# Patient Record
Sex: Female | Born: 1950
Health system: Southern US, Community
[De-identification: ages and names within clinical notes are randomized; demographics above are authoritative.]

## PROBLEM LIST (undated history)

## (undated) ENCOUNTER — Emergency Department (HOSPITAL_COMMUNITY): Admission: EM | Discharge status: Against Medical Advice | Payer: Self-pay

## (undated) DIAGNOSIS — K5792 Diverticulitis of intestine, part unspecified, without perforation or abscess without bleeding: Secondary | ICD-10-CM

## (undated) DIAGNOSIS — R51 Headache: Secondary | ICD-10-CM

## (undated) DIAGNOSIS — I10 Essential (primary) hypertension: Secondary | ICD-10-CM

## (undated) DIAGNOSIS — E119 Type 2 diabetes mellitus without complications: Secondary | ICD-10-CM

## (undated) DIAGNOSIS — R519 Headache, unspecified: Secondary | ICD-10-CM

## (undated) DIAGNOSIS — E079 Disorder of thyroid, unspecified: Secondary | ICD-10-CM

## (undated) DIAGNOSIS — D126 Benign neoplasm of colon, unspecified: Secondary | ICD-10-CM

## (undated) HISTORY — PX: ABDOMINAL HYSTERECTOMY: SHX81

## (undated) HISTORY — PX: HYSTERECTOMY ABDOMINAL WITH SALPINGO-OOPHORECTOMY: SHX6792

## (undated) HISTORY — PX: COLON SURGERY: SHX602

## (undated) HISTORY — DX: Benign neoplasm of colon, unspecified: D12.6

## (undated) HISTORY — PX: APPENDECTOMY: SHX54

## (undated) HISTORY — PX: TONSILLECTOMY: SUR1361

---

## 2001-03-22 ENCOUNTER — Encounter: Payer: Self-pay | Admitting: Obstetrics and Gynecology

## 2001-03-22 ENCOUNTER — Ambulatory Visit (HOSPITAL_COMMUNITY): Admission: RE | Admit: 2001-03-22 | Discharge: 2001-03-22 | Payer: Self-pay | Admitting: Obstetrics and Gynecology

## 2001-08-10 ENCOUNTER — Other Ambulatory Visit: Admission: RE | Admit: 2001-08-10 | Discharge: 2001-08-10 | Payer: Self-pay | Admitting: Obstetrics and Gynecology

## 2002-02-15 ENCOUNTER — Ambulatory Visit (HOSPITAL_COMMUNITY): Admission: RE | Admit: 2002-02-15 | Discharge: 2002-02-15 | Payer: Self-pay | Admitting: Family Medicine

## 2002-02-15 ENCOUNTER — Encounter: Payer: Self-pay | Admitting: Family Medicine

## 2002-02-20 ENCOUNTER — Ambulatory Visit (HOSPITAL_COMMUNITY): Admission: RE | Admit: 2002-02-20 | Discharge: 2002-02-20 | Payer: Self-pay | Admitting: Family Medicine

## 2002-02-27 ENCOUNTER — Ambulatory Visit (HOSPITAL_COMMUNITY): Admission: RE | Admit: 2002-02-27 | Discharge: 2002-02-27 | Payer: Self-pay | Admitting: Cardiology

## 2002-02-27 ENCOUNTER — Encounter: Payer: Self-pay | Admitting: Cardiology

## 2002-04-01 ENCOUNTER — Inpatient Hospital Stay (HOSPITAL_COMMUNITY): Admission: RE | Admit: 2002-04-01 | Discharge: 2002-04-03 | Payer: Self-pay | Admitting: Obstetrics and Gynecology

## 2003-01-07 ENCOUNTER — Ambulatory Visit (HOSPITAL_COMMUNITY): Admission: RE | Admit: 2003-01-07 | Discharge: 2003-01-07 | Payer: Self-pay | Admitting: Family Medicine

## 2003-01-07 ENCOUNTER — Encounter: Payer: Self-pay | Admitting: Family Medicine

## 2004-08-02 ENCOUNTER — Ambulatory Visit (HOSPITAL_COMMUNITY): Admission: RE | Admit: 2004-08-02 | Discharge: 2004-08-02 | Payer: Self-pay | Admitting: Family Medicine

## 2004-09-13 ENCOUNTER — Ambulatory Visit (HOSPITAL_COMMUNITY): Admission: RE | Admit: 2004-09-13 | Discharge: 2004-09-13 | Payer: Self-pay | Admitting: Internal Medicine

## 2004-09-13 ENCOUNTER — Ambulatory Visit: Payer: Self-pay | Admitting: Internal Medicine

## 2004-09-17 ENCOUNTER — Emergency Department (HOSPITAL_COMMUNITY): Admission: EM | Admit: 2004-09-17 | Discharge: 2004-09-18 | Payer: Self-pay | Admitting: *Deleted

## 2004-11-29 ENCOUNTER — Encounter (INDEPENDENT_AMBULATORY_CARE_PROVIDER_SITE_OTHER): Payer: Self-pay | Admitting: *Deleted

## 2004-11-29 ENCOUNTER — Ambulatory Visit (HOSPITAL_COMMUNITY): Admission: RE | Admit: 2004-11-29 | Discharge: 2004-11-29 | Payer: Self-pay | Admitting: Gastroenterology

## 2005-03-31 ENCOUNTER — Inpatient Hospital Stay (HOSPITAL_COMMUNITY): Admission: RE | Admit: 2005-03-31 | Discharge: 2005-04-05 | Payer: Self-pay | Admitting: General Surgery

## 2005-03-31 ENCOUNTER — Encounter (INDEPENDENT_AMBULATORY_CARE_PROVIDER_SITE_OTHER): Payer: Self-pay | Admitting: Specialist

## 2006-03-26 DIAGNOSIS — D126 Benign neoplasm of colon, unspecified: Secondary | ICD-10-CM

## 2006-03-26 HISTORY — DX: Benign neoplasm of colon, unspecified: D12.6

## 2006-08-14 ENCOUNTER — Ambulatory Visit (HOSPITAL_COMMUNITY): Admission: RE | Admit: 2006-08-14 | Discharge: 2006-08-14 | Payer: Self-pay | Admitting: Family Medicine

## 2007-02-07 ENCOUNTER — Ambulatory Visit (HOSPITAL_COMMUNITY): Admission: RE | Admit: 2007-02-07 | Discharge: 2007-02-07 | Payer: Self-pay | Admitting: Family Medicine

## 2007-10-29 ENCOUNTER — Ambulatory Visit (HOSPITAL_COMMUNITY): Admission: RE | Admit: 2007-10-29 | Discharge: 2007-10-29 | Payer: Self-pay | Admitting: Family Medicine

## 2007-12-25 ENCOUNTER — Ambulatory Visit (HOSPITAL_COMMUNITY): Admission: RE | Admit: 2007-12-25 | Discharge: 2007-12-25 | Payer: Self-pay | Admitting: Family Medicine

## 2009-07-24 ENCOUNTER — Ambulatory Visit (HOSPITAL_COMMUNITY): Admission: RE | Admit: 2009-07-24 | Discharge: 2009-07-24 | Payer: Self-pay | Admitting: Family Medicine

## 2009-08-18 ENCOUNTER — Ambulatory Visit (HOSPITAL_COMMUNITY): Admission: RE | Admit: 2009-08-18 | Discharge: 2009-08-18 | Payer: Self-pay | Admitting: Family Medicine

## 2009-09-14 ENCOUNTER — Ambulatory Visit (HOSPITAL_COMMUNITY): Admission: RE | Admit: 2009-09-14 | Discharge: 2009-09-15 | Payer: Self-pay | Admitting: General Surgery

## 2010-05-11 ENCOUNTER — Ambulatory Visit (HOSPITAL_COMMUNITY): Admission: RE | Admit: 2010-05-11 | Discharge: 2010-05-11 | Payer: Self-pay | Admitting: Family Medicine

## 2010-07-22 ENCOUNTER — Encounter: Admission: RE | Admit: 2010-07-22 | Discharge: 2010-07-22 | Payer: Self-pay | Admitting: Gastroenterology

## 2010-08-24 ENCOUNTER — Ambulatory Visit (HOSPITAL_COMMUNITY): Admission: RE | Admit: 2010-08-24 | Discharge: 2010-08-24 | Payer: Self-pay | Admitting: Internal Medicine

## 2010-12-27 LAB — CBC
HCT: 44.5 % (ref 36.0–46.0)
Hemoglobin: 14.8 g/dL (ref 12.0–15.0)
RBC: 4.8 MIL/uL (ref 3.87–5.11)
WBC: 6.4 10*3/uL (ref 4.0–10.5)

## 2010-12-27 LAB — COMPREHENSIVE METABOLIC PANEL
Albumin: 4.6 g/dL (ref 3.5–5.2)
Alkaline Phosphatase: 64 U/L (ref 39–117)
BUN: 10 mg/dL (ref 6–23)
Chloride: 104 mEq/L (ref 96–112)
Glucose, Bld: 67 mg/dL — ABNORMAL LOW (ref 70–99)
Potassium: 5.1 mEq/L (ref 3.5–5.1)
Total Bilirubin: 0.8 mg/dL (ref 0.3–1.2)

## 2010-12-27 LAB — GLUCOSE, CAPILLARY: Glucose-Capillary: 174 mg/dL — ABNORMAL HIGH (ref 70–99)

## 2010-12-27 LAB — DIFFERENTIAL
Basophils Absolute: 0 10*3/uL (ref 0.0–0.1)
Basophils Relative: 1 % (ref 0–1)
Monocytes Absolute: 0.4 10*3/uL (ref 0.1–1.0)
Neutro Abs: 3.2 10*3/uL (ref 1.7–7.7)
Neutrophils Relative %: 49 % (ref 43–77)

## 2011-02-11 NOTE — Op Note (Signed)
NAME:  Megan Hill, Megan Hill               ACCOUNT NO.:  [id number]   MEDICAL RECORD NO.:  [id number]          PATIENT TYPE:  INP   LOCATION:  X001                         FACILITY:  [facility]   PHYSICIAN:  Adolph Pollack, M.D.DATE OF BIRTH:  1951-01-29   DATE OF PROCEDURE:  03/31/2005  DATE OF DISCHARGE:                                 OPERATIVE REPORT   PREOPERATIVE DIAGNOSIS:  Persistent tubular adenomatous polyp, proximal  transverse colon   POSTOPERATIVE DIAGNOSIS:  Persistent tubular adenomatous polyp, proximal  transverse colon   PROCEDURE:  Laparoscopic-assisted extended right colectomy.   SURGEON:  Dr. Abbey Chatters.   ASSISTANT:  Marca Ancona, M.D.   ANESTHESIA:  General.   INDICATION:  Megan Hill is a 60 year old female who had screening  colonoscopy and was noted have a large villous-type polyp in the proximal to  mid transverse colon at that time. This was biopsied and demonstrated a  villous adenoma with postoperative increased dysplasia. She had a repeat  colonoscopy by Dr. Evette Cristal here in town and in the distal portion of the  ascending colon or more proximal portion of the transverse colon, there was  a villous-type, difficult to see and which appeared to be about 1.5 to 2 cm  in length. It was biopsied and it was a tubular-type adenoma with no high-  grade dysplasia or malignancy.  It was marked with [place] ink. However, it  was not able to be removed completely. She was given options including  advanced endoscopic techniques versus flexible partial colectomy and she  chose partial colectomy and presents for that today.   TECHNIQUE:  She is seen in the holding area. She is then brought to the  operating room, placed supine on the operating table and general anesthetic  was administered. Foley catheter was placed in the bladder. Orogastric tube  was placed. The abdominal wall was sterilely prepped and draped. A small  incision was made in a left upper quadrant and a 5 mm  trocar with the  laparoscope in it was then inserted through the abdominal wall into multiple  layers.  However, getting to peritoneal layer, I was unable to press through  this with minimal pressure. Subsequently, I made a subumbilical incision  through the skin and subcutaneous tissue, fascia and peritoneum, entering  the peritoneal cavity under direct vision. A pursestring suture of 0 Vicryl  was placed around fascial edges. An Hassan trocar was introduced in the  peritoneal cavity.  The pneumoperitoneum was created by insufflation of CO2  gas.   A laparoscopic was then introduced.  I looked in the left upper left upper  quadrant and peritoneum without apparent problems.  I then placed a 5 mm  trocar into the left upper quadrant under direct vision and traded this out  for a 10 mm trocar. The 5 mm trocar was then placed in the left mid lateral  abdomen. I identified the cecum and the ileocecal valve. I mobilized the  right colon and distal ileum by dividing the lateral attachments and  medializing the colon. I kept the dissection above the plane of the ureter.  She had a fairly floppy transverse colon. I was able to identify the [place]  ink mark just near the hepatic flexure in the proximal transverse colon. I  then mobilized the transverse colon by incising its thin attachments. Once I  had adequate mobilization of the right and transverse colon and distal ileum  I then made a periumbilical incision for the extraction site, dividing the  skin and subcutaneous tissue, fascia and peritoneum. I then was able to  exteriorize the distal ileum, right colon and transverse colon up to its mid  portion.  I divided the transverse colon its proximal one-third, distal two-  thirds area just proximal to the middle colic vessels. I then divided the  distal ileum with both these done with the GIA stapler. The mesenteric  vessels were then divided between clamps and the vessels ligated. The  specimen  was taken off the field with adequate mesentery of the right colon  and part of the transverse colon, thus more of an extended-type right  colectomy. I opened up the specimen on the back table and identified the  polypoid lesion and the site of the [place] inking marking.  It was fairly  flat, measuring about 2 cm as was described colonoscopically.   Gloves were changed. I then performed a side-to-side stapled anastomosis  closing the remaining enterotomy defect with a linear non cutting stapler. I  over sewed this area with Vicryl suture and reinforced the distal anterior  staple line to decrease tension with a 3-0 Vicryl suture. The anastomosis  was patent, viable and under no tension.   Gloves were then changed and irrigation was performed. Initially, we  evacuated some clots but the irrigant returned clear and no active bleeding  was noted. Needle, sponge and instrument counts were reported to be correct.  The periumbilical midline incision was then inspected and the fascia closed  with a running #1 PDS suture. The subcutaneous tissue was then irrigated. I  re-insufflated the abdomen at low pressure and examined the area. No  bleeding was noted. Minimal irrigation fluid was located in the perihepatic  region. I then released the pneumoperitoneum and removed the trocars. All  skin incisions were then closed with staples and sterile dressings were  applied.   She tolerated the procedure well without any apparent complications and she  was taken to the recovery room in satisfactory condition.       TJR/MEDQ  D:  03/31/2005  T:  03/31/2005  Job:  [id number]   cc:   Graylin Shiver, M.D.  [address]  [address]  [place], [place] [address]  Fax: [phone]   W. Simone Curia, M.D.  [address]  [place]  [place] [address]  Fax: [phone]

## 2011-02-11 NOTE — Discharge Summary (Signed)
NAME:  Megan Hill, Megan Hill               ACCOUNT NO.:  [id number]   MEDICAL RECORD NO.:  [id number]          PATIENT TYPE:  INP   LOCATION:  1430                         FACILITY:  [facility]   PHYSICIAN:  Adolph Pollack, M.D.DATE OF BIRTH:  1951/06/29   DATE OF ADMISSION:  03/31/2005  DATE OF DISCHARGE:  04/05/2005                                 DISCHARGE SUMMARY   PRINCIPAL DISCHARGE DIAGNOSIS:  Villous adenoma, transverse colon.   SECONDARY DIAGNOSES:  1.  Postoperative ileus.  2.  Vasovagal attack versus seizure.  3.  Hypertension.  4.  Diabetes mellitus.  5.  Hypercholesterolemia.  6.  Depression.  7.  Hypothyroidism.   PROCEDURE:  Laparoscopic-assisted extended right colectomy on March 31, 2005.   REASON FOR ADMISSION:  This 60 year old female had a screening colonoscopy  in [place], and was found to have a large villous-type polyp lesion in  the transverse colon.  It was biopsied, and demonstrated villous adenoma  with a foci of dysplasia.  She subsequently underwent repeat colonoscopy,  and the entire polyp could not be removed.  It was felt to be just distal to  the hepatic flexure.  She was admitted for elective partial colectomy.   HOSPITAL COURSE:  She tolerated the procedure well.  Postoperatively,  sliding-scale insulin was started.  She did well with these.  She had a mild  ileus, and was kept on somewhat of a liquid diet.  She was having small BMs,  but only after suppository.  She did have one episode which they felt may  have been seizure activity.  It was when she just took a hot shower.  A head  CT was negative.  Pathology came back a benign sessile, villous-type lesion.  No malignancy.  Neurology consultation was obtained.  It was felt this could  be a vasovagal episode versus seizure.  EEG was normal.  Her diet was able  to be advanced, and she spontaneously had bowel movements, and was passing  gas.  The wounds looked clean.  She was started on her oral  diabetic  medications, and her blood sugars were under good control.  By postop day  #5, she was ready for discharge.   DISPOSITION:  Discharged to home on postop day #5, April 05, 2005.  She is to  call Dr. Sandria Manly for a followup appointment and to arrange for an MRI of the  brain.  She will come to my office in a few days to have her staples  removed.  She was given Vicodin for pain, told to take her home medicines,  and given activity restrictions.       TJR/MEDQ  D:  04/11/2005  T:  04/11/2005  Job:  [id number]   cc:   Dorisann Frames, M.D.  [address] [place] [address]  Fax: [phone]   Scott A. Gerda Diss, MD  [address]  [place]  [place] [address]  Fax: [phone]   Graylin Shiver, M.D.  [address]  [address]  [place], [place] [address]  Fax: [phone]   Genene Churn. Love,  M.D.  [address]  [address]  [place]  [place] [address]  Fax: [phone]

## 2011-02-11 NOTE — Op Note (Signed)
NAME:  Megan Hill, Megan Hill               ACCOUNT NO.:  [id number]   MEDICAL RECORD NO.:  [id number]          PATIENT TYPE:  AMB   LOCATION:  DAY                           FACILITY:  [facility]   PHYSICIAN:  Lionel December, M.D.    DATE OF BIRTH:  October 31, 1950   DATE OF PROCEDURE:  09/13/2004  DATE OF DISCHARGE:                                 OPERATIVE REPORT   A very large polyp at proximal mid-transverse colon, which was snared  piecemeal.  Polypectomy was not complete.  However, more than 90% of the  polyp was snared.  I was unable to catch the pieces in a basket or snare  because of a lot of liquid stool in the colon.  Mucosa of the rest of the  colon was normal.  Rectal mucosa similarly was normal.  The scope was  retroflexed to examine the anorectal junction, which was unremarkable.  The  endoscope was straightened and withdrawn.  The patient tolerated the  procedure well.   FINAL DIAGNOSES:  1.  A redundant colon.  2.  Melanosis coli.  3.  Very large polyp at mid-to-proximal transverse colon, which was snared      piecemeal.  Most of the polyp was removed.  The polypectomy was      incomplete.   RECOMMENDATIONS:  1.  Standard instructions given.  2.  No aspirin for 10 days.  3.  She will be on clear liquids for 24 hours.  Should she experience      abdominal pain or bleeding, she will contact me through the hospital      operator.  Further recommendations to be made once histology is      available for review.  If it is a benign polyp without invasive      carcinoma, I believe the rest of the polyp should be amenable to      endoscopic therapy.     Naje   NR/MEDQ  D:  09/13/2004  T:  09/13/2004  Job:  [id number]

## 2011-02-11 NOTE — H&P (Signed)
[facility]  Patient:    Megan Hill, Megan Hill Visit Number: [id number] MRN: [id number]          Service Type: OUT Location: RAD Attending Physician:  Mirian Mo Dictated by:   Christin Bach, M.D. Admit Date:  02/27/2002 Discharge Date: 02/27/2002   CC:         Christin Bach, M.D.   History and Physical  DATE OF BIRTH:  [birth year].  ADMITTING DIAGNOSIS:  Menometrorrhagia, cystocele, rectocele, and first degree uterine descensus.  HISTORY OF PRESENT ILLNESS:  This 60 year old female followed through our office for routine annual visits is admitted at this time for hysterectomy for pelvic relaxation symptoms and heavy irregular menses.  The patient was seen for an annual visit in November with straightforward condition at that point and time other than bulbous cervix.  Exam in June of this year identified a very markedly relaxed posterior wall, moderate anterior uterine relaxation with a symptomatic rectocele to greater than 90 degrees relaxation.  The cervix was anterior first degree uterine descensus present with a bulbous cervix.  The patient is scheduled for vaginal hysterectomy with probable removal of ovaries vaginally, along with posterior repair.  Considerations of anterior repair to be decided at the time of surgery.  PAST MEDICAL HISTORY:  History of chest pain recently evaluated by Dr. Valera Castle with negative workup for cardiac problems.  PAST SURGICAL HISTORY:  Tubal ligation in 1977.  Tonsillectomy in 1960.  ALLERGIES:  CODEINE causes nausea.  PENICILLIN allergy as a child.  MEDICATIONS:  None at the present.  PHYSICAL EXAMINATION:  VITAL SIGNS:  Height 5 feet 3 inches.  Weight 174.  Blood pressure 120/75.  GENERAL:  A healthy-appearing energetic Caucasian female alert and oriented x 3.  HEENT:  Pupils are equal, round and reactive.  Extraocular movements intact.  NECK:  Supple.  Trachea midline.  CHEST:   Clear to auscultation.  BREASTS:  Normal without masses.  ABDOMEN:  Nontender without masses or hernias.  PELVIC:  External genitalia shows a relaxed introitus.  Bulbous cervix. Anterior uterus with first degree uterine descensus present.  Adnexa is negative for masses.  Posterior rectocele with significant anterior wall support superior to the posterior wall and decision regarding its repair will be determined at the time of general anesthesia.  PLAN: 1. Vaginal hysterectomy. 2. Bilateral salpingo-oophorectomy. 3. Posterior repair and possible anterior repair on April 01, 2002. Dictated by:   Christin Bach, M.D. Attending Physician:  Mirian Mo DD:  03/31/02 TD:  03/31/02 Job: [id number]

## 2011-02-11 NOTE — Op Note (Signed)
[facility]  Patient:    Megan Hill, Megan Hill Visit Number: [id number] MRN: [id number]          Service Type: MED Location: 4A A427 01 Attending Physician:  Tilda Burrow Dictated by:   Christin Bach, M.D. Proc. Date: 04/01/02 Admit Date:  04/01/2002 Discharge Date: 04/03/2002                             Operative Report  PREOPERATIVE DIAGNOSES: 1. First degree uterine descensus. 2. Menometrorrhagia. 3. Rectocele.  POSTOPERATIVE DIAGNOSES: 1. First degree uterine descensus. 2. Menometrorrhagia. 3. Rectocele.  PROCEDURES: 1. Vaginal hysterectomy, bilateral salpingo-oophorectomy. 2. Posterior repair.  SURGEON:  Christin Bach, M.D.  FIRST ASSISTANT:  [employer], C.S.T.  ANESTHESIA:  General.  COMPLICATIONS:  None.  ESTIMATED BLOOD LOSS:  300 cc.  FINDINGS:  Rectocele, upper limits of normal size uterus.  DESCRIPTION OF PROCEDURE:  The patient was taken to the operating room, prepped and draped for vaginal procedure with legs supported in _____ supports.  Posterior colpotomy incision was made above a three-inch weighted speculum and the peritoneal cavity entered with ease.  Uterosacral ligaments were clamped with curved Zeppelin clamp, transected, and suture ligated with 0 chromic and tagged.  A longer six-inch speculum was inserted behind the uterus.  The anterior cervicovaginal fornix was opened with Bovie cautery through the vaginal mucosa, then elevated and the bladder lifted off and held up with a retractor.  We then clamped the lower cardinal ligaments with curved Zeppelin clamp with transection of the ligaments and suture ligation.  The upper cardinal ligaments were serially clamped, cut, and suture ligated as well.  At this point we entered into the anterior peritoneum in front of the uterus and behind the bladder.  The uterine vessels were clamped, cut, and suture ligated on either side.  We marched serially up the  sides of the uterus with serial small bites with Zeppelin clamps, followed by [facility] scissor transection and 0 chromic suture ligation.  A figure-of-eight suture was required on the right side to address some continued bleeding from an area of the vessels.  We marched up each side to the level of the adnexal structures, and the utero-ovarian ligament, fallopian tube, and round ligament complex were crossclamped and doubly ligated.  Pedicles were inspected for hemostasis and a figure-of-eight suture placed as necessary to improve hemostasis.  At this point in time an assessment was made to remove ovaries.  Both ovaries and fallopian tubes could be identified, grasped with Babcock clamp, and pulled downward sufficiently to crossclamp the infundibulopelvic ligament with a Zeppelin clamp, which was then used as we doubly ligated the pedicle and excised the surgical specimen.  The patient then had closure of the cuff by 2-0 chromic closure of the peritoneal cavity after placement of a 2-0 silk suture between the uterosacral ligaments posteriorly.  Once the peritoneum was closed, the silk suture was tied down and the supportive cuff was subsequently quite good.  The cuff was then closed in the midline and hysterectomy considered satisfactorily completed.  Posterior repair:  Posterior repair then proceeded by infiltrating the vaginal mucosa overlying the rectum with Xylocaine with epinephrine solution, then splitting the posterior vaginal mucosa in the midline.  The vagina and vaginal mucosa was elevated off of the underlying connective tissue, and a doubly-gloved right index finger was inserted in the rectum to help allow [place] to identify good supportive tissues just above the rectum.  Allis clamps were used to grasp this tissue, which was then held in position, gloves changed, and we then proceeded to pull the pararectal tissues into the midline overlying the rectal mucosa in such a way to build a  strong, stable supportive shelf between the rectum and the vagina.  Then the vaginal mucosa was trimmed and then the edges of the tissue were then pulled in the midline with 2-0 chromic interrupted sutures.  Vaginal packing was placed, Foley catheter inserted, revealing clear urine, and the patient allowed to go the recovery room in good condition.  Estimated blood loss _____ cc. Dictated by:   Christin Bach, M.D. Attending Physician:  Tilda Burrow DD:  04/18/02 TD:  04/23/02 Job: [id number]

## 2011-02-11 NOTE — Procedures (Signed)
   NAME:  Megan Hill, Megan Hill NO.:  [id number]   MEDICAL RECORD NO.:  [id number]                  PATIENT TYPE:   LOCATION:                                       FACILITY:   PHYSICIAN:  Donna Bernard, M.D.             DATE OF BIRTH:   DATE OF PROCEDURE:  02/20/2002  DATE OF DISCHARGE:                                    STRESS TEST   INDICATION FOR TEST:  The patient is experiencing chest discomfort at times  with exertion.  She does have some risk factors along with a family history  of coronary artery disease.  Stress testing is performed for risk  stratification.   PROCEDURE:  Stress test was performed with standard Bruce protocol.  Resting  EKG revealed normal sinus rhythm with no significant ST-T changes.  The  patient tolerated the first 2 stages relatively well.  At the end of the  second stage, her test was stopped on the basis of fatigue and shortness of  breath.  She reached a maximum heart rate of 154 with a sub max predicted of  146.  At this point, at .08 seconds past the J point, there was significant  ST segment depression, as much as 2-3 ml in leads 2 and 3 and AVF at the ST  segment at .08 seconds from the J point was relatively flat.  Similar  finding was in D4 through D6.   IMPRESSION:  Positive adequate stress test.   PLAN:  Cardiology referral to be performed by me.                                               Donna Bernard, M.D.    WSL/MEDQ  D:  09/07/2002  T:  09/08/2002  Job:  [id number]

## 2011-02-11 NOTE — Procedures (Signed)
DIAGNOSTIC DESCRIPTION:  This EEG was recorded during the awake state.  Background activity shows 10 Hz rhythms with higher amplitudes seen at  posterior head regions bilaterally. Photic stimulation and hyperventilation  testing were not performed. There was no evidence of any focal asymmetry or  stage II sleep present. There was no evidence of any epileptiform activity  seen.   IMPRESSION:  This is a normal EEG during the awake state.       UXL:KGMW  D:  04/04/2005 17:52:19  T:  04/04/2005 22:28:05  Job #:  [id number]

## 2011-02-11 NOTE — Discharge Summary (Signed)
NAME:  Megan Hill, Megan Hill                         ACCOUNT NO.:  [id number]   MEDICAL RECORD NO.:  [id number]                   PATIENT TYPE:  INP   LOCATION:  A427                                 FACILITY:  [facility]   PHYSICIAN:  Tilda Burrow, M.D.              DATE OF BIRTH:  [birth year]   DATE OF ADMISSION:  04/01/2002  DATE OF DISCHARGE:  04/03/2002                                 DISCHARGE SUMMARY   ADMITTING DIAGNOSES:  Menometrorrhagia, uterine fibroids, rectocele.   DISCHARGE DIAGNOSES:  1st degree uterine descensus.  Menometrorrhagia.  Rectocele.   PROCEDURE:  On 04/01/2002, vaginal hysterectomy, bilateral salpingo-  oophorectomy, posterior repair; Tilda Burrow, M.D.   DISCHARGE MEDICATIONS:  Tylox 1 q.4h p.r.n. pain, dispense 30; iron tablets  325 mg, iron sulfate tablets 325 mg 1 p.o. q.d. x 30 days, Surfak 240 mg  stool softener 2 tablets daily.   FOLLOW UP:  Follow-up in three weeks and six weeks in our office.   HISTORY OF PRESENT ILLNESS:  This is a 60 year old female followed through  our office for regular annual exams was admitted for hysterectomy for pelvic  relaxation symptoms and heavy irregular menses.  There was a symptomatic  rectocele present.  1st degree uterine descensus is present.  There is  questionable relaxation of the aspects of the anterior vagina, not  necessarily requiring repair.   PAST MEDICAL HISTORY:  A history of chest pain evaluated by Dr. Daleen Squibb with  negative work up. A tubal ligation in 1977.  A tonsillectomy in 1960.   ALLERGIES:  CODEINE causing nausea.  PENICILLIN allergies.   PHYSICAL EXAMINATION:  VITAL SIGNS:  Height 5 feet 3 inches, weight 174, BP  120/75.  PELVIS:  Pelvic exams showing lax introitus, vulvar, cervix, 1st degree  descensus.  No masses in the adnexa.  Rectocele present with anterior  support considered borderline for anterior repair.   HOSPITAL COURSE:  Vaginal hysterectomy with removal of ovaries and  posterior  repair was performed 04/01/2002.  The patient has been getting labs including  hemoglobin 12.6, hematocrit 36.2, WBC count 8,900.  On postoperative day  one, the patient had pulse in the 70s with hemoglobin 10.2, hematocrit 30  with abdomen soft and slight fullness present.  The patient looked more pale  than we would have anticipated.  Urine output was 800 cc in two hours that  day, so hydration was considered excellent.  Postop day two, hemoglobin was  10 even, hematocrit 28.9, BUN 3, creatinine 0.9.  The patient was  complaining of minor gas pains and vomiting, but was stable and considered a  candidate for home management.  She is discharged home after Dulcolax  suppository resulted in passage of gas and some loose stool.    CONDITION ON DISCHARGE:  Stable.   FOLLOW UP:  Follow-up as stated through my office.  Tilda Burrow, M.D.    JVF/MEDQ  D:  04/18/2002  T:  04/28/2002  Job:  [id number]   cc:   Tilda Burrow, M.D.

## 2011-02-11 NOTE — Procedures (Signed)
[facility]  Patient:    Megan Hill, Megan Hill Visit Number: [id number] MRN: [id number]          Service Type: OUT Location: RAD Attending Physician:  Cain Sieve Dictated by:   Jacolyn Reedy, P.A.C. Proc. Date: 02/27/02 Admit Date:  02/27/2002 Discharge Date: 02/27/2002                                Stress Test  This is a 60 year old white female patient who had an abnormal stress test last week.  Also has a family history of coronary artery disease and hyperlipidemia.  She had some upsloping ST changes associated with chest tightness on stress test last week.  Baseline EKG:  Normal sinus rhythm. Patient exercised seven minutes 45 seconds of the Bruce protocol which is a minute and 45 seconds longer than last week.  She obtained a heart rate of 145, target heart rate 146.  She complained of mild chest tightness and had 1-1.5 mm upsloping ST depression inferolaterally which resolved in recovery. Cardiolite images are to follow. Dictated by:   Jacolyn Reedy, P.A.C. Attending Physician:  Cain Sieve DD:  02/27/02 TD:  03/01/02 Job: [id number]

## 2011-02-11 NOTE — Op Note (Signed)
NAME:  Megan Hill, Megan Hill NO.:  [id number]   MEDICAL RECORD NO.:  [id number]          PATIENT TYPE:  AMB   LOCATION:  ENDO                         FACILITY:  [facility]   PHYSICIAN:  Graylin Shiver, M.D.   DATE OF BIRTH:  03-19-1951   DATE OF PROCEDURE:  11/29/2004  DATE OF DISCHARGE:                                 OPERATIVE REPORT   PROCEDURE:  Colonoscopy with biopsy and [place] ink marking.   INDICATIONS:  This patient is a 60 year old female who in December of last  year underwent a colonoscopy with polypectomy with removal of a portion of a  large tubulovillous adenoma from the region of the proximal to mid  transverse colon.  The procedure was done in [place].  The patient  requested further evaluation here to reassess polyp and came to see me.   Informed consent was obtained after explanation of the risks of bleeding,  infection, and perforation.   PREMEDICATIONS:  Fentanyl 100 mcg IV, Versed 10 mg IV.   PROCEDURE:  With the patient in the left lateral decubitus position, a  rectal exam was performed.  No masses were felt.  The Olympus colonoscope  was inserted into the rectum and advanced around the colon to the cecum.  Cecal landmarks were identified.  The colon was tortuous.  The cecum  revealed a small 3 mm polyp that was biopsied off.  In the region of the  distal portion of the ascending colon, or proximal portion of the transverse  colon, there was a carpet-like polyp on a fold, which was very often  difficult to see.  I was able to get a photograph of it.  It appeared to be  about 1.5 cm or 2 cm in length.  The polyp on the fold kept flopping back  into a crevice, and it made it difficult to see and keep in view.  I did  obtain multiple biopsies from the longest carpet-like polyp, thinking that  this was probably the area that was previously seen and the large polyp was  removed, although I was not clear on this because the prior site had not  been  marked with [place] ink.  I did obtain multiple biopsies, which will be  checked.  I also inked this area.  I placed two submucosal injections of  [place] ink just distal to the site where the polyp was for future  localization.  The scope was brought out, visualizing the rest of the colon.  No other specific abnormalities were seen in the transverse colon,  descending colon, sigmoid, or rectum.  She tolerate the procedure well  without obvious complications.   IMPRESSION:  Polyp, which has the appearance of a carpet-like polyp in the  region of the distal ascending or proximal portion of the transverse colon.  Multiple biopsies were obtained, and the area was inked for future  localization.   COMMENTS:  I saw no where specifically to safely snare this polyp, and  options will be discussed with the patient after review of the biopsies.  Considerations could be given for referral for  endoscopic mucosal  dissection, surgery  for repeat colonoscopy with argon plasma coagulation of this area, should  this area be a benign tubulovillous adenoma.   PLAN:  I would recommend a follow-up screening colonoscopy again in 10  years.      SFG/MEDQ  D:  11/29/2004  T:  11/29/2004  Job:  [id number]

## 2011-02-11 NOTE — Consult Note (Signed)
NAME:  Megan Hill, Megan Hill               ACCOUNT NO.:  [id number]   MEDICAL RECORD NO.:  [id number]          PATIENT TYPE:  INP   LOCATION:  1609                         FACILITY:  [facility]   PHYSICIAN:  Genene Churn. Love, M.D.    DATE OF BIRTH:  [birth year]   DATE OF CONSULTATION:  DATE OF DISCHARGE:                                   CONSULTATION   This 60 year old right-handed white married female from [place], [place], seen for evaluation of syncope versus seizure.   HISTORY OF PRESENT ILLNESS:  [name] was admitted for elective  laparoscopy, colectomy for adenomatous polyp, and during her hospitalization  on April 03, 2005, was in the shower. She had two episodes that were brief  lasting 15 to 30 seconds each while in the shower of loss of consciousness  with a feeling of being in a dream-like euphoric state. These lasted 15-30  seconds. There was tonic, clonic activity. There was eye rolling with them.  She would awake very quickly afterwards within 15 seconds and was not  confused. There was no witnessed urinary or bowel incontinence. She had no  tongue biting. She has a history of dazed episodes, but no deja vu or jaime  vu, strange odors, or taste. She does report that 15 years ago she had a  similar episode while standing that again lasted for 15 to 30 seconds. She  was taken to [facility] emergency room in [place], [place],  where she had studies done, but no EEG or MRI, and was released. She had no  family history of seizures. She denies any significant headaches other than  migraine headaches which resolved three years ago with a hysterectomy. She  has no known history of drug or alcohol abuse.   PAST MEDICAL HISTORY:  Significant for diabetes mellitus, hypertension,  hyperlipidemia, and hypothyroidism.   MEDICATIONS:  At home include Glucophage 500 mg b.i.d., Lipitor dosage  unknown once per day, aspirin 81 mg per day, Synthroid 125 mcg once per day.  Medications in the hospital included heparin 5000 q.8h., Synthroid 125 mcg  daily, sliding scale insulin, Reglan, D-5 and half-normal saline, and p.r.n.  drugs  including Phenergan, Demerol, hydrocodone, and Ambien.   LABORATORY DATA:  Initial CBC reveals white blood cell count of 17,500 with  a hemoglobin of 15.2, hematocrit 44.5, platelet count 276,000. Repeat on  April 03, 2005, revealed white blood cell count of 16,000, hemoglobin 14.1,  hematocrit 41.0, platelet count 275,000. Protime was 12.6 with an INR of  0.9. Serum sodium was initially 143, potassium 4.9, chloride 105, CO2 30,  glucose 95, BUN 11, creatinine 0.9. Liver function tests and albumin all  normal. Repeat basic metabolic panel on April 03, 2005, revealed a glucose of  106 and otherwise studies normal. Urinalysis was unremarkable.   EKG showed normal sinus rhythm and possible left atrial enlargement. CT scan  of the brain is normal.   IMPRESSION:  1.  Vasovagal attack (code 780.2) versus seizure (code 345.10) with past      history of similar episodes and also a history of multiple blackout  spells in the past thought to be vasovagal.  2.  Migraine headaches (code 346.1).  3.  Hyperlipidemia (code 272.4).  4.  Diabetes mellitus (code 250.60).  5.  Hypertension (code 796.2).  6.  Recent adenomatous colon surgery.  7.  Hypothyroidism (code 244.9).   PLAN:  At this time transfer to telemetry, obtain a TSH, place her on  seizure precautions, obtain an MRI when possible as she now has staples and  cannot do it at this time.       JML/MEDQ  D:  04/04/2005  T:  04/04/2005  Job:  [id number]   cc:   Donna Bernard, M.D.  [address]  [place]  [place] [address]  Fax: [phone]

## 2011-11-09 ENCOUNTER — Other Ambulatory Visit (HOSPITAL_COMMUNITY): Payer: Self-pay | Admitting: Internal Medicine

## 2011-11-09 DIAGNOSIS — Z139 Encounter for screening, unspecified: Secondary | ICD-10-CM

## 2011-11-15 ENCOUNTER — Ambulatory Visit (HOSPITAL_COMMUNITY)
Admission: RE | Admit: 2011-11-15 | Discharge: 2011-11-15 | Disposition: A | Discharge status: Home / Self Care | Payer: BC Managed Care – PPO | Source: Ambulatory Visit | Attending: Internal Medicine | Admitting: Internal Medicine

## 2011-11-15 DIAGNOSIS — Z1231 Encounter for screening mammogram for malignant neoplasm of breast: Secondary | ICD-10-CM | POA: Insufficient documentation

## 2011-11-15 DIAGNOSIS — Z139 Encounter for screening, unspecified: Secondary | ICD-10-CM

## 2012-04-06 ENCOUNTER — Other Ambulatory Visit (HOSPITAL_COMMUNITY): Payer: Self-pay | Admitting: Internal Medicine

## 2012-04-06 DIAGNOSIS — R945 Abnormal results of liver function studies: Secondary | ICD-10-CM

## 2012-04-10 ENCOUNTER — Ambulatory Visit (HOSPITAL_COMMUNITY)
Admission: RE | Admit: 2012-04-10 | Discharge: 2012-04-10 | Disposition: A | Discharge status: Home / Self Care | Payer: BC Managed Care – PPO | Source: Ambulatory Visit | Attending: Internal Medicine | Admitting: Internal Medicine

## 2012-04-10 DIAGNOSIS — Z860100 Personal history of colon polyps, unspecified: Secondary | ICD-10-CM | POA: Insufficient documentation

## 2012-04-10 DIAGNOSIS — R945 Abnormal results of liver function studies: Secondary | ICD-10-CM | POA: Insufficient documentation

## 2012-04-10 DIAGNOSIS — Z9089 Acquired absence of other organs: Secondary | ICD-10-CM | POA: Insufficient documentation

## 2012-04-10 DIAGNOSIS — K7689 Other specified diseases of liver: Secondary | ICD-10-CM | POA: Insufficient documentation

## 2013-03-20 ENCOUNTER — Ambulatory Visit (HOSPITAL_COMMUNITY)
Admission: RE | Admit: 2013-03-20 | Discharge: 2013-03-20 | Disposition: A | Discharge status: Home / Self Care | Payer: BC Managed Care – PPO | Source: Ambulatory Visit | Attending: Family Medicine | Admitting: Family Medicine

## 2013-03-20 ENCOUNTER — Other Ambulatory Visit (HOSPITAL_COMMUNITY): Payer: Self-pay | Admitting: Family Medicine

## 2013-03-20 DIAGNOSIS — R1032 Left lower quadrant pain: Secondary | ICD-10-CM

## 2013-03-25 ENCOUNTER — Emergency Department (HOSPITAL_COMMUNITY)
Admission: EM | Admit: 2013-03-25 | Discharge: 2013-03-25 | Disposition: A | Discharge status: Home / Self Care | Payer: BC Managed Care – PPO | Attending: Emergency Medicine | Admitting: Emergency Medicine

## 2013-03-25 ENCOUNTER — Emergency Department (HOSPITAL_COMMUNITY): Payer: BC Managed Care – PPO

## 2013-03-25 ENCOUNTER — Encounter (HOSPITAL_COMMUNITY): Payer: Self-pay | Admitting: *Deleted

## 2013-03-25 DIAGNOSIS — R109 Unspecified abdominal pain: Secondary | ICD-10-CM

## 2013-03-25 DIAGNOSIS — M549 Dorsalgia, unspecified: Secondary | ICD-10-CM | POA: Insufficient documentation

## 2013-03-25 DIAGNOSIS — Z860100 Personal history of colon polyps, unspecified: Secondary | ICD-10-CM | POA: Insufficient documentation

## 2013-03-25 DIAGNOSIS — R5381 Other malaise: Secondary | ICD-10-CM | POA: Insufficient documentation

## 2013-03-25 DIAGNOSIS — E119 Type 2 diabetes mellitus without complications: Secondary | ICD-10-CM | POA: Insufficient documentation

## 2013-03-25 DIAGNOSIS — Z9089 Acquired absence of other organs: Secondary | ICD-10-CM | POA: Insufficient documentation

## 2013-03-25 DIAGNOSIS — K921 Melena: Secondary | ICD-10-CM | POA: Insufficient documentation

## 2013-03-25 DIAGNOSIS — Z7982 Long term (current) use of aspirin: Secondary | ICD-10-CM | POA: Insufficient documentation

## 2013-03-25 DIAGNOSIS — Z880 Allergy status to penicillin: Secondary | ICD-10-CM | POA: Insufficient documentation

## 2013-03-25 DIAGNOSIS — R51 Headache: Secondary | ICD-10-CM | POA: Insufficient documentation

## 2013-03-25 DIAGNOSIS — E079 Disorder of thyroid, unspecified: Secondary | ICD-10-CM | POA: Insufficient documentation

## 2013-03-25 DIAGNOSIS — R197 Diarrhea, unspecified: Secondary | ICD-10-CM | POA: Insufficient documentation

## 2013-03-25 DIAGNOSIS — R6883 Chills (without fever): Secondary | ICD-10-CM | POA: Insufficient documentation

## 2013-03-25 DIAGNOSIS — M7989 Other specified soft tissue disorders: Secondary | ICD-10-CM | POA: Insufficient documentation

## 2013-03-25 DIAGNOSIS — Z8719 Personal history of other diseases of the digestive system: Secondary | ICD-10-CM | POA: Insufficient documentation

## 2013-03-25 DIAGNOSIS — Z79899 Other long term (current) drug therapy: Secondary | ICD-10-CM | POA: Insufficient documentation

## 2013-03-25 DIAGNOSIS — R110 Nausea: Secondary | ICD-10-CM | POA: Insufficient documentation

## 2013-03-25 DIAGNOSIS — R1032 Left lower quadrant pain: Secondary | ICD-10-CM | POA: Insufficient documentation

## 2013-03-25 DIAGNOSIS — Z90710 Acquired absence of both cervix and uterus: Secondary | ICD-10-CM | POA: Insufficient documentation

## 2013-03-25 DIAGNOSIS — I10 Essential (primary) hypertension: Secondary | ICD-10-CM | POA: Insufficient documentation

## 2013-03-25 HISTORY — DX: Type 2 diabetes mellitus without complications: E11.9

## 2013-03-25 HISTORY — DX: Essential (primary) hypertension: I10

## 2013-03-25 HISTORY — DX: Disorder of thyroid, unspecified: E07.9

## 2013-03-25 HISTORY — DX: Diverticulitis of intestine, part unspecified, without perforation or abscess without bleeding: K57.92

## 2013-03-25 LAB — BASIC METABOLIC PANEL
CO2: 27 mEq/L (ref 19–32)
Calcium: 10.7 mg/dL — ABNORMAL HIGH (ref 8.4–10.5)
Chloride: 100 mEq/L (ref 96–112)
Creatinine, Ser: 0.89 mg/dL (ref 0.50–1.10)
Glucose, Bld: 92 mg/dL (ref 70–99)
Sodium: 140 mEq/L (ref 135–145)

## 2013-03-25 LAB — CBC WITH DIFFERENTIAL/PLATELET
Basophils Absolute: 0.1 10*3/uL (ref 0.0–0.1)
Eosinophils Relative: 3 % (ref 0–5)
HCT: 44.2 % (ref 36.0–46.0)
Lymphocytes Relative: 42 % (ref 12–46)
Lymphs Abs: 3.2 10*3/uL (ref 0.7–4.0)
MCV: 91.3 fL (ref 78.0–100.0)
Monocytes Absolute: 0.5 10*3/uL (ref 0.1–1.0)
Neutro Abs: 3.6 10*3/uL (ref 1.7–7.7)
RBC: 4.84 MIL/uL (ref 3.87–5.11)
RDW: 12.7 % (ref 11.5–15.5)
WBC: 7.6 10*3/uL (ref 4.0–10.5)

## 2013-03-25 LAB — URINALYSIS, ROUTINE W REFLEX MICROSCOPIC
Nitrite: NEGATIVE
Specific Gravity, Urine: 1.01 (ref 1.005–1.030)
Urobilinogen, UA: 0.2 mg/dL (ref 0.0–1.0)
pH: 5.5 (ref 5.0–8.0)

## 2013-03-25 LAB — GLUCOSE, CAPILLARY: Glucose-Capillary: 84 mg/dL (ref 70–99)

## 2013-03-25 LAB — LIPASE, BLOOD: Lipase: 83 U/L — ABNORMAL HIGH (ref 11–59)

## 2013-03-25 MED ORDER — ONDANSETRON HCL 4 MG/2ML IJ SOLN
4.0000 mg | Freq: Once | INTRAMUSCULAR | Status: AC
  Administered 2013-03-25: 4 mg via INTRAVENOUS
  Filled 2013-03-25: qty 2

## 2013-03-25 MED ORDER — IOHEXOL 300 MG/ML  SOLN
50.0000 mL | Freq: Once | INTRAMUSCULAR | Status: AC | PRN
  Administered 2013-03-25: 50 mL via ORAL

## 2013-03-25 MED ORDER — SODIUM CHLORIDE 0.9 % IV SOLN: INTRAVENOUS | Status: DC

## 2013-03-25 MED ORDER — SODIUM CHLORIDE 0.9 % IV BOLUS (SEPSIS)
1000.0000 mL | Freq: Once | INTRAVENOUS | Status: AC
  Administered 2013-03-25: 1000 mL via INTRAVENOUS

## 2013-03-25 MED ORDER — IOHEXOL 300 MG/ML  SOLN
100.0000 mL | Freq: Once | INTRAMUSCULAR | Status: AC | PRN
  Administered 2013-03-25: 100 mL via INTRAVENOUS

## 2013-03-25 MED ORDER — HYDROMORPHONE HCL PF 1 MG/ML IJ SOLN
0.5000 mg | Freq: Once | INTRAMUSCULAR | Status: AC
  Administered 2013-03-25: 0.5 mg via INTRAVENOUS
  Filled 2013-03-25: qty 1

## 2013-03-25 NOTE — ED Notes (Signed)
Pt states she is unable to void at this time, and she will notify staff when she can void.

## 2013-03-25 NOTE — ED Provider Notes (Signed)
History    This chart was scribed for Megan Jakes, MD by Donne Anon, ED Scribe. This patient was seen in room APA15/APA15 and the patient's care was started at 1319.  CSN: [id number] Arrival date & time 03/25/13  1116  First MD Initiated Contact with Patient 03/25/13 1316     Chief Complaint  Patient presents with  . Rectal Bleeding    The history is provided by the patient. No language interpreter was used.   HPI Comments: Megan Hill is a 62 y.o. female who presents to the Emergency Department complaining of 5 days of gradual onset, gradually worsening black stools. She saw her PCP 5 days ago about this is, was clinically diagnosed by an xray with diverticulitis, and is currently taking Cipro and Flagyl.  She states the waxing and waning LLQ cramping abdominal pain began 2 weeks ago, feel cramping and it radiates to her back. The black stools also began 2 weeks ago. She currently rates her pain as 3/10, but states it is 9/10 at its worst. She reports associated nausea and diarrhea(baseline). She denies red streaks in her stool or pain with bowel movements. She denies a hx of diverticulitis. She had part of her colon ressected in 2006 for a large polyp. Her last colonoscopy was 5 years ago.   Dr. Jethro Bastos with [facility] is her GI. Dr. Nelva Bush at [facility] is her PCP.  Past Medical History  Diagnosis Date  . Diverticulitis   . Diabetes mellitus without complication   . Thyroid disease   . Hypertension    Past Surgical History  Procedure Laterality Date  . Abdominal hysterectomy    . Tonsillectomy    . Appendectomy    . Colon surgery     History reviewed. No pertinent family history. History  Substance Use Topics  . Smoking status: Never Smoker   . Smokeless tobacco: Not on file  . Alcohol Use: No   OB History   Grav Para Term Preterm Abortions TAB SAB Ect Mult Living                 Review of Systems  Constitutional: Positive for chills and fatigue.   HENT: Negative for sore throat, rhinorrhea, sneezing and neck pain.   Eyes: Negative for visual disturbance.  Respiratory: Negative for cough and shortness of breath.   Cardiovascular: Positive for leg swelling. Negative for chest pain.  Gastrointestinal: Positive for nausea, diarrhea and blood in stool.  Genitourinary: Negative for dysuria.  Musculoskeletal: Positive for back pain.  Skin: Negative for rash.  Neurological: Positive for headaches.  Hematological: Does not bruise/bleed easily.  Psychiatric/Behavioral: Negative for confusion.    Allergies  Penicillins  Home Medications   Current Outpatient Rx  Name  Route  Sig  Dispense  Refill  . aspirin EC 81 MG tablet   Oral   Take 81 mg by mouth daily.         . ciprofloxacin (CIPRO) 500 MG tablet   Oral   Take 500 mg by mouth 2 (two) times daily.         Marland Kitchen levothyroxine (SYNTHROID, LEVOTHROID) 100 MCG tablet   Oral   Take 100 mcg by mouth daily before breakfast. Takes about an hour before breakfast         . losartan-hydrochlorothiazide (HYZAAR) 50-12.5 MG per tablet   Oral   Take 1 tablet by mouth daily.         . metFORMIN (GLUCOPHAGE) 500 MG tablet  Oral   Take 500 mg by mouth 2 (two) times daily with a meal.         . metroNIDAZOLE (FLAGYL) 500 MG tablet   Oral   Take 500 mg by mouth 2 (two) times daily.          BP 171/78  Pulse 70  Temp(Src) 97.4 F (36.3 C) (Oral)  Resp 18  Ht 5\' 5"  (1.651 m)  Wt 180 lb (81.647 kg)  BMI 29.95 kg/m2  SpO2 98%  Physical Exam  Nursing note and vitals reviewed. Constitutional: She is oriented to person, place, and time. She appears well-developed and well-nourished. No distress.  HENT:  Head: Normocephalic and atraumatic.  Mouth/Throat: Oropharynx is clear and moist.  Eyes: Conjunctivae and EOM are normal. No scleral icterus.  Neck: Neck supple. No tracheal deviation present.  Cardiovascular: Normal rate, regular rhythm and normal heart sounds.   No  murmur heard. Pulmonary/Chest: Effort normal and breath sounds normal. No respiratory distress.  Abdominal: Soft. Bowel sounds are normal. She exhibits no distension and no mass. There is no tenderness. There is no guarding.  Genitourinary: Rectal exam shows no external hemorrhoid, no fissure, no mass, no tenderness and anal tone normal.  Evidence of old external hemorroids but nothing active. No fissure. Minimal stool in vault.   Musculoskeletal: Normal range of motion.  Neurological: She is alert and oriented to person, place, and time. No cranial nerve deficit. She exhibits normal muscle tone. Coordination normal.  Skin: Skin is warm and dry.  Cap refill <1 second  Psychiatric: She has a normal mood and affect. Her behavior is normal.    ED Course  Procedures (including critical care time) DIAGNOSTIC STUDIES: Oxygen Saturation is 98% on RA, normal by my interpretation.    COORDINATION OF CARE: 1:34 PM Discussed treatment plan which includes IV fluids, 4 mg Zofran, .5 mg Dilaudid, labs, urinalysis, rectal exam and CT scan with pt at bedside and pt agreed to plan.   3:45 PM Rechecked pt. Discussed lab and imaging results. Rectal exam performed. Advised pt to follow up with her PCP for a possible colonoscopy. I will discharge home. Pt denies any pain medication at this time.   Results for orders placed during the hospital encounter of 03/25/13  CBC WITH DIFFERENTIAL      Result Value Range   WBC 7.6  4.0 - 10.5 K/uL   RBC 4.84  3.87 - 5.11 MIL/uL   Hemoglobin 15.2 (*) 12.0 - 15.0 g/dL   HCT 16.1  09.6 - 04.5 %   MCV 91.3  78.0 - 100.0 fL   MCH 31.4  26.0 - 34.0 pg   MCHC 34.4  30.0 - 36.0 g/dL   RDW 40.9  81.1 - 91.4 %   Platelets 272  150 - 400 K/uL   Neutrophils Relative % 48  43 - 77 %   Neutro Abs 3.6  1.7 - 7.7 K/uL   Lymphocytes Relative 42  12 - 46 %   Lymphs Abs 3.2  0.7 - 4.0 K/uL   Monocytes Relative 6  3 - 12 %   Monocytes Absolute 0.5  0.1 - 1.0 K/uL   Eosinophils  Relative 3  0 - 5 %   Eosinophils Absolute 0.3  0.0 - 0.7 K/uL   Basophils Relative 1  0 - 1 %   Basophils Absolute 0.1  0.0 - 0.1 K/uL  BASIC METABOLIC PANEL      Result Value Range   Sodium 140  135 - 145 mEq/L   Potassium 3.7  3.5 - 5.1 mEq/L   Chloride 100  96 - 112 mEq/L   CO2 27  19 - 32 mEq/L   Glucose, Bld 92  70 - 99 mg/dL   BUN 11  6 - 23 mg/dL   Creatinine, Ser 4.09  0.50 - 1.10 mg/dL   Calcium 81.1 (*) 8.4 - 10.5 mg/dL   GFR calc non Af Amer 70 (*) >90 mL/min   GFR calc Af Amer 81 (*) >90 mL/min  GLUCOSE, CAPILLARY      Result Value Range   Glucose-Capillary 84  70 - 99 mg/dL  LIPASE, BLOOD      Result Value Range   Lipase 83 (*) 11 - 59 U/L  URINALYSIS, ROUTINE W REFLEX MICROSCOPIC      Result Value Range   Color, Urine YELLOW  YELLOW   APPearance CLEAR  CLEAR   Specific Gravity, Urine 1.010  1.005 - 1.030   pH 5.5  5.0 - 8.0   Glucose, UA NEGATIVE  NEGATIVE mg/dL   Hgb urine dipstick NEGATIVE  NEGATIVE   Bilirubin Urine NEGATIVE  NEGATIVE   Ketones, ur NEGATIVE  NEGATIVE mg/dL   Protein, ur NEGATIVE  NEGATIVE mg/dL   Urobilinogen, UA 0.2  0.0 - 1.0 mg/dL   Nitrite NEGATIVE  NEGATIVE   Leukocytes, UA NEGATIVE  NEGATIVE   Dg Abd 1 View  03/20/2013   *RADIOLOGY REPORT*  Clinical Data: Abdominal pain.  Left lower quadrant pain.  ABDOMEN - 1 VIEW  Comparison: None.  Findings: Bowel staples are present in the right mid abdomen. Prominent stool in the ascending colon. Cholecystectomy clips are present in the right upper quadrant.  Bowel gas pattern is nonobstructive.  No dilated loops of large or small bowel. Phleboliths in the pelvis.  No definite renal or ureteral calculi.  IMPRESSION: No acute abnormality.   Original Report Authenticated By: Andreas Newport, M.D.   Ct Abdomen Pelvis W Contrast  03/25/2013   *RADIOLOGY REPORT*  Clinical Data: Left lower quadrant pain for 2 weeks, black stools for 5 days, left flank pain, on antibiotics for diverticulitis  CT ABDOMEN  AND PELVIS WITH CONTRAST  Technique:  Multidetector CT imaging of the abdomen and pelvis was performed following the standard protocol during bolus administration of intravenous contrast. Sagittal and coronal MPR images reconstructed from axial data set.  Contrast: 50mL OMNIPAQUE IOHEXOL 300 MG/ML  SOLN, OMNIPAQUE IOHEXOL 300 MG/ML  SOLN  Comparison: None  Findings: Dependent atelectasis at both lung bases. Liver, spleen, pancreas, kidneys, and adrenal glands normal appearance. Small periumbilical hernia containing fat. Bladder and ureters normal appearance. Uterus surgically absent with nonvisualization of ovaries. Prior ileocolic resection with patent appearing anastomosis. Stomach and bowel loops otherwise normal appearance. No mass, adenopathy, free fluid for inflammatory process. No acute osseous findings.  IMPRESSION: Small periumbilical hernia containing fat. Prior ileocolic resection. No acute intra-abdominal or intrapelvic abnormalities.   Original Report Authenticated By: Ulyses Southward, M.D.        Medications  0.9 %  sodium chloride infusion (not administered)  sodium chloride 0.9 % bolus 1,000 mL (1,000 mLs Intravenous New Bag/Given 03/25/13 1440)  ondansetron (ZOFRAN) injection 4 mg (4 mg Intravenous Given 03/25/13 1440)  HYDROmorphone (DILAUDID) injection 0.5 mg (0.5 mg Intravenous Given 03/25/13 1525)  iohexol (OMNIPAQUE) 300 MG/ML solution 50 mL (50 mLs Oral Contrast Given 03/25/13 1429)  iohexol (OMNIPAQUE) 300 MG/ML solution 100 mL (100 mLs Intravenous Contrast Given 03/25/13 1458)  Results for orders placed during the hospital encounter of 03/25/13  CBC WITH DIFFERENTIAL      Result Value Range   WBC 7.6  4.0 - 10.5 K/uL   RBC 4.84  3.87 - 5.11 MIL/uL   Hemoglobin 15.2 (*) 12.0 - 15.0 g/dL   HCT 16.1  09.6 - 04.5 %   MCV 91.3  78.0 - 100.0 fL   MCH 31.4  26.0 - 34.0 pg   MCHC 34.4  30.0 - 36.0 g/dL   RDW 40.9  81.1 - 91.4 %   Platelets 272  150 - 400 K/uL   Neutrophils  Relative % 48  43 - 77 %   Neutro Abs 3.6  1.7 - 7.7 K/uL   Lymphocytes Relative 42  12 - 46 %   Lymphs Abs 3.2  0.7 - 4.0 K/uL   Monocytes Relative 6  3 - 12 %   Monocytes Absolute 0.5  0.1 - 1.0 K/uL   Eosinophils Relative 3  0 - 5 %   Eosinophils Absolute 0.3  0.0 - 0.7 K/uL   Basophils Relative 1  0 - 1 %   Basophils Absolute 0.1  0.0 - 0.1 K/uL  BASIC METABOLIC PANEL      Result Value Range   Sodium 140  135 - 145 mEq/L   Potassium 3.7  3.5 - 5.1 mEq/L   Chloride 100  96 - 112 mEq/L   CO2 27  19 - 32 mEq/L   Glucose, Bld 92  70 - 99 mg/dL   BUN 11  6 - 23 mg/dL   Creatinine, Ser 7.82  0.50 - 1.10 mg/dL   Calcium 95.6 (*) 8.4 - 10.5 mg/dL   GFR calc non Af Amer 70 (*) >90 mL/min   GFR calc Af Amer 81 (*) >90 mL/min  GLUCOSE, CAPILLARY      Result Value Range   Glucose-Capillary 84  70 - 99 mg/dL  LIPASE, BLOOD      Result Value Range   Lipase 83 (*) 11 - 59 U/L  URINALYSIS, ROUTINE W REFLEX MICROSCOPIC      Result Value Range   Color, Urine YELLOW  YELLOW   APPearance CLEAR  CLEAR   Specific Gravity, Urine 1.010  1.005 - 1.030   pH 5.5  5.0 - 8.0   Glucose, UA NEGATIVE  NEGATIVE mg/dL   Hgb urine dipstick NEGATIVE  NEGATIVE   Bilirubin Urine NEGATIVE  NEGATIVE   Ketones, ur NEGATIVE  NEGATIVE mg/dL   Protein, ur NEGATIVE  NEGATIVE mg/dL   Urobilinogen, UA 0.2  0.0 - 1.0 mg/dL   Nitrite NEGATIVE  NEGATIVE   Leukocytes, UA NEGATIVE  NEGATIVE     1. Abdominal pain     MDM  Patient referred in now for possible diverticulitis. Was treated clinically for that with Cipro and Flagyl said 2 weeks of left lower corner abdominal pain. CT scan here today is negative for diverticulosis or ideas no significant abnormalities found on the exam of and a small periumbilical hernia but does not have any bowel in it. Rectal exam is heme negative patient referred back to primary care Dr. for further evaluation. Colonoscopy may be the next. Patient's in no acute distress. Patient did  not want any pain medicine. Patient knows she can stop the antibiotics.  Labs show possible pancreatitis with a slight elevation in lipase however her pain is not in that area clinically it does not fit. But this is a possibility.  I personally  performed the services described in this documentation, which was scribed in my presence. The recorded information has been reviewed and is accurate.     Megan Jakes, MD 03/25/13 [phone]

## 2013-03-25 NOTE — ED Notes (Signed)
POC occult blood performed and completed by Dr. Saul Fordyce. POC occult result was negative.

## 2013-03-25 NOTE — ED Notes (Signed)
Pt taking cipro for diverticulitis, black stools for 5 days,  Nausea, no vomiting. Lt flank pain.  Alert,

## 2015-10-09 ENCOUNTER — Other Ambulatory Visit (HOSPITAL_COMMUNITY): Payer: Self-pay | Admitting: Family Medicine

## 2015-10-09 DIAGNOSIS — Z1231 Encounter for screening mammogram for malignant neoplasm of breast: Secondary | ICD-10-CM

## 2015-10-12 ENCOUNTER — Ambulatory Visit (HOSPITAL_COMMUNITY)
Admission: RE | Admit: 2015-10-12 | Discharge: 2015-10-12 | Disposition: A | Discharge status: Home / Self Care | Payer: BC Managed Care – PPO | Source: Ambulatory Visit | Attending: Family Medicine | Admitting: Family Medicine

## 2015-10-12 DIAGNOSIS — Z1231 Encounter for screening mammogram for malignant neoplasm of breast: Secondary | ICD-10-CM

## 2015-10-28 HISTORY — PX: COLONOSCOPY: SHX174

## 2016-03-03 ENCOUNTER — Other Ambulatory Visit (HOSPITAL_COMMUNITY): Payer: Self-pay | Admitting: Family Medicine

## 2016-03-03 ENCOUNTER — Ambulatory Visit (HOSPITAL_COMMUNITY)
Admission: RE | Admit: 2016-03-03 | Discharge: 2016-03-03 | Disposition: A | Discharge status: Home / Self Care | Payer: BC Managed Care – PPO | Source: Ambulatory Visit | Attending: Family Medicine | Admitting: Family Medicine

## 2016-03-03 DIAGNOSIS — R062 Wheezing: Secondary | ICD-10-CM

## 2017-05-10 ENCOUNTER — Other Ambulatory Visit: Payer: Self-pay | Admitting: Dermatology

## 2017-07-25 ENCOUNTER — Emergency Department (HOSPITAL_COMMUNITY): Payer: BC Managed Care – PPO

## 2017-07-25 ENCOUNTER — Emergency Department (HOSPITAL_COMMUNITY)
Admission: EM | Admit: 2017-07-25 | Discharge: 2017-07-25 | Disposition: A | Discharge status: Home / Self Care | Payer: BC Managed Care – PPO | Attending: Emergency Medicine | Admitting: Emergency Medicine

## 2017-07-25 ENCOUNTER — Encounter (HOSPITAL_COMMUNITY): Payer: Self-pay | Admitting: Emergency Medicine

## 2017-07-25 DIAGNOSIS — Z7982 Long term (current) use of aspirin: Secondary | ICD-10-CM | POA: Diagnosis not present

## 2017-07-25 DIAGNOSIS — Z880 Allergy status to penicillin: Secondary | ICD-10-CM | POA: Insufficient documentation

## 2017-07-25 DIAGNOSIS — E119 Type 2 diabetes mellitus without complications: Secondary | ICD-10-CM | POA: Insufficient documentation

## 2017-07-25 DIAGNOSIS — I10 Essential (primary) hypertension: Secondary | ICD-10-CM | POA: Insufficient documentation

## 2017-07-25 DIAGNOSIS — Z7984 Long term (current) use of oral hypoglycemic drugs: Secondary | ICD-10-CM | POA: Diagnosis not present

## 2017-07-25 DIAGNOSIS — R42 Dizziness and giddiness: Secondary | ICD-10-CM | POA: Diagnosis present

## 2017-07-25 DIAGNOSIS — Z79899 Other long term (current) drug therapy: Secondary | ICD-10-CM | POA: Diagnosis not present

## 2017-07-25 DIAGNOSIS — H81399 Other peripheral vertigo, unspecified ear: Secondary | ICD-10-CM | POA: Insufficient documentation

## 2017-07-25 HISTORY — DX: Headache: R51

## 2017-07-25 HISTORY — DX: Headache, unspecified: R51.9

## 2017-07-25 LAB — CBC WITH DIFFERENTIAL/PLATELET
Basophils Absolute: 0.1 10*3/uL (ref 0.0–0.1)
Basophils Relative: 1 %
EOS ABS: 0.3 10*3/uL (ref 0.0–0.7)
Eosinophils Relative: 3 %
HEMATOCRIT: 45.4 % (ref 36.0–46.0)
HEMOGLOBIN: 15.7 g/dL — AB (ref 12.0–15.0)
LYMPHS ABS: 2.4 10*3/uL (ref 0.7–4.0)
LYMPHS PCT: 31 %
MCH: 31.9 pg (ref 26.0–34.0)
MCHC: 34.6 g/dL (ref 30.0–36.0)
MCV: 92.3 fL (ref 78.0–100.0)
MONOS PCT: 4 %
Monocytes Absolute: 0.3 10*3/uL (ref 0.1–1.0)
NEUTROS PCT: 61 %
Neutro Abs: 4.7 10*3/uL (ref 1.7–7.7)
Platelets: 233 10*3/uL (ref 150–400)
RBC: 4.92 MIL/uL (ref 3.87–5.11)
RDW: 12.2 % (ref 11.5–15.5)
WBC: 7.7 10*3/uL (ref 4.0–10.5)

## 2017-07-25 LAB — TROPONIN I: Troponin I: <0.03 ng/mL (ref <0.03)

## 2017-07-25 LAB — COMPREHENSIVE METABOLIC PANEL
ALK PHOS: 47 U/L (ref 38–126)
ALT: 31 U/L (ref 14–54)
ANION GAP: 9 (ref 5–15)
AST: 24 U/L (ref 15–41)
Albumin: 4.5 g/dL (ref 3.5–5.0)
BILIRUBIN TOTAL: 1.2 mg/dL (ref 0.3–1.2)
BUN: 16 mg/dL (ref 6–20)
CO2: 25 mmol/L (ref 22–32)
Calcium: 9.6 mg/dL (ref 8.9–10.3)
Chloride: 100 mmol/L — ABNORMAL LOW (ref 101–111)
Creatinine, Ser: 0.79 mg/dL (ref 0.44–1.00)
GFR calc non Af Amer: >60 mL/min (ref >60)
GLUCOSE: 168 mg/dL — AB (ref 65–99)
Potassium: 3.8 mmol/L (ref 3.5–5.1)
Sodium: 134 mmol/L — ABNORMAL LOW (ref 135–145)
TOTAL PROTEIN: 7.3 g/dL (ref 6.5–8.1)

## 2017-07-25 LAB — URINALYSIS, ROUTINE W REFLEX MICROSCOPIC
BACTERIA UA: NONE SEEN
Bilirubin Urine: NEGATIVE
GLUCOSE, UA: NEGATIVE mg/dL
Hgb urine dipstick: NEGATIVE
KETONES UR: NEGATIVE mg/dL
Nitrite: NEGATIVE
PROTEIN: NEGATIVE mg/dL
Specific Gravity, Urine: 1.016 (ref 1.005–1.030)
pH: 7 (ref 5.0–8.0)

## 2017-07-25 LAB — LIPASE, BLOOD: Lipase: 33 U/L (ref 11–51)

## 2017-07-25 MED ORDER — DIPHENHYDRAMINE HCL 50 MG/ML IJ SOLN
50.0000 mg | Freq: Once | INTRAMUSCULAR | Status: AC
  Administered 2017-07-25: 50 mg via INTRAVENOUS
  Filled 2017-07-25: qty 1

## 2017-07-25 MED ORDER — MECLIZINE HCL 12.5 MG PO TABS
ORAL_TABLET | ORAL | Status: AC
  Filled 2017-07-25: qty 1

## 2017-07-25 MED ORDER — MECLIZINE HCL 12.5 MG PO TABS
25.0000 mg | ORAL_TABLET | Freq: Once | ORAL | Status: AC
  Administered 2017-07-25: 25 mg via ORAL
  Filled 2017-07-25: qty 2

## 2017-07-25 MED ORDER — PROMETHAZINE HCL 25 MG PO TABS: 25.0000 mg | ORAL_TABLET | Freq: Four times a day (QID) | ORAL | 0 refills | Status: DC | PRN

## 2017-07-25 MED ORDER — PROMETHAZINE HCL 25 MG/ML IJ SOLN
12.5000 mg | Freq: Once | INTRAMUSCULAR | Status: AC
  Administered 2017-07-25: 12.5 mg via INTRAVENOUS
  Filled 2017-07-25: qty 1

## 2017-07-25 MED ORDER — ONDANSETRON HCL 4 MG/2ML IJ SOLN
4.0000 mg | INTRAMUSCULAR | Status: DC | PRN
  Administered 2017-07-25: 4 mg via INTRAVENOUS
  Filled 2017-07-25: qty 2

## 2017-07-25 NOTE — ED Triage Notes (Signed)
Pt reports waking with severe dizziness.  States she had a bad headache a few days ago which had resolved.  Dizziness is accompanied by nausea/vomiting and increases with head movement.  Describes as room spinning.

## 2017-07-25 NOTE — ED Notes (Addendum)
Pt ambulated to BR and back to room. Given water to drink

## 2017-07-25 NOTE — Discharge Instructions (Signed)
Take over the counter tylenol, ibuprofen and benadryl, as directed on packaging, with the prescription given to you today, as needed for headache. Take just the prescription given to you today, if your symptoms of "spinning" return.  Call your regular medical doctor today to schedule a follow up appointment within the next 3  days.  Return to the Emergency Department immediately sooner if worsening.

## 2017-07-25 NOTE — ED Provider Notes (Signed)
[facility] EMERGENCY DEPARTMENT Provider Note   CSN: [id number] Arrival date & time: 07/25/17  4098     History   Chief Complaint Chief Complaint  Patient presents with  . Dizziness    HPI Megan Hill is a 66 y.o. female.  HPI  Pt was seen at 0905. Per pt, c/o gradual onset and persistence of multiple intermittent episodes of "spinning" for the past 3 days. Has been associated with N/V. Symptoms worsen with turning her head side to side, improves with staying still. Pt endorses hx of headache, states she had a headache 3 days ago, since resolved. States she has had "a cold" recently also, since improved. Describes the headache as per her usual chronic headache pain pattern. Denies headache was sudden or maximal at onset or at any time. Denies diarrhea, no black or blood in stools or emesis, no CP/SOB, no back or neck pain, no visual changes, no focal motor weakness, no tingling/numbness in extremities, no ataxia, no slurred speech, no facial droop.    Past Medical History:  Diagnosis Date  . Diabetes mellitus without complication ([place])   . Diverticulitis   . Headache   . Hypertension   . Thyroid disease     There are no active problems to display for this patient.   Past Surgical History:  Procedure Laterality Date  . ABDOMINAL HYSTERECTOMY    . APPENDECTOMY    . COLON SURGERY    . TONSILLECTOMY      OB History    No data available       Home Medications    Prior to Admission medications   Medication Sig Start Date End Date Taking? Authorizing Provider  aspirin EC 81 MG tablet Take 81 mg by mouth daily.    [provider]  ciprofloxacin (CIPRO) 500 MG tablet Take 500 mg by mouth 2 (two) times daily.    [provider]  levothyroxine (SYNTHROID, LEVOTHROID) 100 MCG tablet Take 100 mcg by mouth daily before breakfast. Takes about an hour before breakfast    [provider]  losartan-hydrochlorothiazide (HYZAAR) 50-12.5 MG per  tablet Take 1 tablet by mouth daily.    [provider]  metFORMIN (GLUCOPHAGE) 500 MG tablet Take 500 mg by mouth 2 (two) times daily with a meal.    [provider]  metroNIDAZOLE (FLAGYL) 500 MG tablet Take 500 mg by mouth 2 (two) times daily.    [provider]    Family History History reviewed. No pertinent family history.  Social History Social History  Substance Use Topics  . Smoking status: Never Smoker  . Smokeless tobacco: Not on file  . Alcohol use No     Allergies   Penicillins   Review of Systems Review of Systems ROS: Statement: All systems negative except as marked or noted in the HPI; Constitutional: Negative for fever and chills. ; ; Eyes: Negative for eye pain, redness and discharge. ; ; ENMT: Negative for ear pain, hoarseness, nasal congestion, sinus pressure and sore throat. ; ; Cardiovascular: Negative for chest pain, palpitations, diaphoresis, dyspnea and peripheral edema. ; ; Respiratory: Negative for cough, wheezing and stridor. ; ; Gastrointestinal: +N/V. Negative for diarrhea, abdominal pain, blood in stool, hematemesis, jaundice and rectal bleeding. . ; ; Genitourinary: Negative for dysuria, flank pain and hematuria. ; ; Musculoskeletal: Negative for back pain and neck pain. Negative for swelling and trauma.; ; Skin: Negative for pruritus, rash, abrasions, blisters, bruising and skin lesion.; ; Neuro: +"spinning." Resolved  chronic headache. Negative for lightheadedness and neck stiffness. Negative for weakness, altered level of consciousness, altered mental status, extremity weakness, paresthesias, involuntary movement, seizure and syncope.       Physical Exam Updated Vital Signs BP (!) 163/76 (BP Location: Left Arm)   Pulse 65   Temp 97.6 F (36.4 C) (Oral)   Resp 18   Ht 5\' 4"  (1.626 m)   Wt 77.1 kg (170 lb)   SpO2 97%   BMI 29.18 kg/m   Physical Exam 0910: Physical examination:  Nursing notes reviewed; Vital signs  and O2 SAT reviewed;  Constitutional: Well developed, Well nourished, Well hydrated, In no acute distress; Head:  Normocephalic, atraumatic; Eyes: EOMI, PERRL, No scleral icterus; ENMT: TM's clear bilat +edemetous nasal turbinates bilat with clear rhinorrhea. Mouth and pharynx normal, Mucous membranes moist; Neck: Supple, Full range of motion, No lymphadenopathy; Cardiovascular: Regular rate and rhythm, No gallop; Respiratory: Breath sounds clear & equal bilaterally, No wheezes.  Speaking full sentences with ease, Normal respiratory effort/excursion; Chest: Nontender, Movement normal; Abdomen: Soft, Nontender, Nondistended, Normal bowel sounds; Genitourinary: No CVA tenderness; Extremities: Pulses normal, No tenderness, No edema, No calf edema or asymmetry.; Neuro: AA&Ox3, Major CN grossly intact. Speech clear.  No facial droop.  No nystagmus, though pt's symptoms occur when she moves her eyes side to side. Grips equal. Strength 5/5 equal bilat UE's and LE's.  DTR 2/4 equal bilat UE's and LE's.  No gross sensory deficits.  Normal cerebellar testing bilat UE's (finger-nose) and LE's (heel-shin).; Skin: Color normal, Warm, Dry.   ED Treatments / Results  Labs (all labs ordered are listed, but only abnormal results are displayed)   EKG  EKG Interpretation  Date/Time:  Tuesday July 25 2017 09:05:54 EDT Ventricular Rate:  63 PR Interval:    QRS Duration: 83 QT Interval:  433 QTC Calculation: 444 R Axis:   66 Text Interpretation:  Sinus rhythm Baseline wander When compared with ECG of 09/10/2009 No significant change was found Confirmed by Francine Graven [phone]) on 07/25/2017 10:07:41 AM       Radiology   Procedures Procedures (including critical care time)  Medications Ordered in ED Medications  ondansetron (ZOFRAN) injection 4 mg (4 mg Intravenous Given 07/25/17 0923)  meclizine (ANTIVERT) 12.5 MG tablet (not administered)  meclizine (ANTIVERT) tablet 25 mg (25 mg Oral Given  07/25/17 7867)     Initial Impression / Assessment and Plan / ED Course  I have reviewed the triage vital signs and the nursing notes.  Pertinent labs & imaging results that were available during my care of the patient were reviewed by me and considered in my medical decision making (see chart for details).  MDM Reviewed: previous chart, nursing note and vitals Reviewed previous: labs and ECG Interpretation: labs, ECG, x-ray, CT scan and MRI   Results for orders placed or performed during the hospital encounter of 07/25/17  Comprehensive metabolic panel  Result Value Ref Range   Sodium 134 (L) 135 - 145 mmol/L   Potassium 3.8 3.5 - 5.1 mmol/L   Chloride 100 (L) 101 - 111 mmol/L   CO2 25 22 - 32 mmol/L   Glucose, Bld 168 (H) 65 - 99 mg/dL   BUN 16 6 - 20 mg/dL   Creatinine, Ser 0.79 0.44 - 1.00 mg/dL   Calcium 9.6 8.9 - 10.3 mg/dL   Total Protein 7.3 6.5 - 8.1 g/dL   Albumin 4.5 3.5 - 5.0 g/dL   AST 24 15 - 41 U/L  ALT 31 14 - 54 U/L   Alkaline Phosphatase 47 38 - 126 U/L   Total Bilirubin 1.2 0.3 - 1.2 mg/dL   GFR calc non Af Amer >60 >60 mL/min   GFR calc Af Amer >60 >60 mL/min   Anion gap 9 5 - 15  Lipase, blood  Result Value Ref Range   Lipase 33 11 - 51 U/L  Troponin I  Result Value Ref Range   Troponin I <0.03 <0.03 ng/mL  CBC with Differential  Result Value Ref Range   WBC 7.7 4.0 - 10.5 K/uL   RBC 4.92 3.87 - 5.11 MIL/uL   Hemoglobin 15.7 (H) 12.0 - 15.0 g/dL   HCT 45.4 36.0 - 46.0 %   MCV 92.3 78.0 - 100.0 fL   MCH 31.9 26.0 - 34.0 pg   MCHC 34.6 30.0 - 36.0 g/dL   RDW 12.2 11.5 - 15.5 %   Platelets 233 150 - 400 K/uL   Neutrophils Relative % 61 %   Neutro Abs 4.7 1.7 - 7.7 K/uL   Lymphocytes Relative 31 %   Lymphs Abs 2.4 0.7 - 4.0 K/uL   Monocytes Relative 4 %   Monocytes Absolute 0.3 0.1 - 1.0 K/uL   Eosinophils Relative 3 %   Eosinophils Absolute 0.3 0.0 - 0.7 K/uL   Basophils Relative 1 %   Basophils Absolute 0.1 0.0 - 0.1 K/uL  Urinalysis,  Routine w reflex microscopic  Result Value Ref Range   Color, Urine YELLOW YELLOW   APPearance HAZY (A) CLEAR   Specific Gravity, Urine 1.016 1.005 - 1.030   pH 7.0 5.0 - 8.0   Glucose, UA NEGATIVE NEGATIVE mg/dL   Hgb urine dipstick NEGATIVE NEGATIVE   Bilirubin Urine NEGATIVE NEGATIVE   Ketones, ur NEGATIVE NEGATIVE mg/dL   Protein, ur NEGATIVE NEGATIVE mg/dL   Nitrite NEGATIVE NEGATIVE   Leukocytes, UA MODERATE (A) NEGATIVE   RBC / HPF 0-5 0 - 5 RBC/hpf   WBC, UA 0-5 0 - 5 WBC/hpf   Bacteria, UA NONE SEEN NONE SEEN   Squamous Epithelial / LPF 0-5 (A) NONE SEEN   Mucus PRESENT    Dg Chest 2 View Result Date: 07/25/2017 CLINICAL DATA:  Dizziness.  Hypertension. EXAM: CHEST  2 VIEW COMPARISON:  March 03, 2016 FINDINGS: Lungs are clear. Heart size and pulmonary vascularity are normal. No adenopathy. No bone lesions. IMPRESSION: No edema or consolidation. Electronically Signed   By: Lowella Grip III M.D.   On: 07/25/2017 10:05   Ct Head Wo Contrast Result Date: 07/25/2017 CLINICAL DATA:  Recent Headaches and dizziness EXAM: CT HEAD WITHOUT CONTRAST TECHNIQUE: Contiguous axial images were obtained from the base of the skull through the vertex without intravenous contrast. COMPARISON:  None. FINDINGS: Brain: No evidence of acute infarction, hemorrhage, hydrocephalus, extra-axial collection or mass lesion/mass effect. Vascular: No hyperdense vessel or unexpected calcification. Skull: Normal. Negative for fracture or focal lesion. Sinuses/Orbits: No acute finding. Other: None. IMPRESSION: No acute abnormality noted. Electronically Signed   By: Inez Catalina M.D.   On: 07/25/2017 10:56   Mr Brain Wo Contrast (neuro Protocol) Result Date: 07/25/2017 CLINICAL DATA:  Headache and weakness over the last 3 days. Negative head CT. EXAM: MRI HEAD WITHOUT CONTRAST TECHNIQUE: Multiplanar, multiecho pulse sequences of the brain and surrounding structures were obtained without intravenous contrast.  COMPARISON:  CT same day. FINDINGS: Brain: Diffusion imaging does not show any acute or subacute infarction. The brainstem and cerebellum are normal. Cerebral hemispheres are normal  except for a few punctate foci of T2 and FLAIR signal within the white matter consistent with minimal small vessel change. No cortical or large vessel territory insult. No mass lesion, hemorrhage, hydrocephalus or extra-axial collection. Vascular: Major vessels at the base of the brain show flow. Skull and upper cervical spine: Negative Sinuses/Orbits: Clear/normal Other: None IMPRESSION: No acute or significant finding. No cause of the presenting symptoms is identified. Normal except for a few punctate foci of T2 and FLAIR signal within the the hemispheric deep white matter, not unusual at this age. Electronically Signed   By: Nelson Chimes M.D.   On: 07/25/2017 11:57    1355:  Pt initially given zofran and antivert with good effect; though symptoms returned. Pt then given IV phenergan and benadryl, and MRI brain ordered. Workup reassuring and now pt denies any return of symptoms. Pt has tol PO well without N/V. Pt has ambulated with steady gait, easy resps, NAD. Pt states she is ready to go home now. Tx symptomatically at this time. Dx and testing d/w pt and family.  Questions answered.  Verb understanding, agreeable to d/c home with outpt f/u.   Final Clinical Impressions(s) / ED Diagnoses   Final diagnoses:  None    New Prescriptions New Prescriptions   No medications on file     Francine Graven, DO 07/30/17 1244

## 2018-02-28 ENCOUNTER — Other Ambulatory Visit: Payer: Self-pay | Admitting: Internal Medicine

## 2018-02-28 DIAGNOSIS — R100 Acute abdomen: Secondary | ICD-10-CM

## 2018-02-28 DIAGNOSIS — M545 Low back pain: Secondary | ICD-10-CM

## 2018-03-01 ENCOUNTER — Ambulatory Visit
Admission: RE | Admit: 2018-03-01 | Discharge: 2018-03-01 | Disposition: A | Discharge status: Home / Self Care | Payer: BC Managed Care – PPO | Source: Ambulatory Visit | Attending: Internal Medicine | Admitting: Internal Medicine

## 2018-03-01 DIAGNOSIS — M545 Low back pain: Secondary | ICD-10-CM

## 2018-03-01 DIAGNOSIS — R100 Acute abdomen: Secondary | ICD-10-CM

## 2018-03-01 MED ORDER — IOPAMIDOL (ISOVUE-300) INJECTION 61%
100.0000 mL | Freq: Once | INTRAVENOUS | Status: AC | PRN
  Administered 2018-03-01: 100 mL via INTRAVENOUS

## 2018-06-26 DIAGNOSIS — E559 Vitamin D deficiency, unspecified: Secondary | ICD-10-CM | POA: Diagnosis not present

## 2018-06-26 DIAGNOSIS — Z780 Asymptomatic menopausal state: Secondary | ICD-10-CM | POA: Diagnosis not present

## 2018-06-26 DIAGNOSIS — E119 Type 2 diabetes mellitus without complications: Secondary | ICD-10-CM | POA: Diagnosis not present

## 2018-06-26 DIAGNOSIS — R6882 Decreased libido: Secondary | ICD-10-CM | POA: Diagnosis not present

## 2018-06-26 DIAGNOSIS — N951 Menopausal and female climacteric states: Secondary | ICD-10-CM | POA: Diagnosis not present

## 2018-06-26 DIAGNOSIS — E2839 Other primary ovarian failure: Secondary | ICD-10-CM | POA: Diagnosis not present

## 2018-06-26 DIAGNOSIS — R5383 Other fatigue: Secondary | ICD-10-CM | POA: Diagnosis not present

## 2018-06-26 DIAGNOSIS — E039 Hypothyroidism, unspecified: Secondary | ICD-10-CM | POA: Diagnosis not present

## 2018-06-27 ENCOUNTER — Other Ambulatory Visit (HOSPITAL_COMMUNITY): Payer: Self-pay | Admitting: Internal Medicine

## 2018-06-27 DIAGNOSIS — Z1231 Encounter for screening mammogram for malignant neoplasm of breast: Secondary | ICD-10-CM

## 2018-07-04 ENCOUNTER — Ambulatory Visit (HOSPITAL_COMMUNITY)
Admission: RE | Admit: 2018-07-04 | Discharge: 2018-07-04 | Disposition: A | Discharge status: Home / Self Care | Payer: Medicare Other | Source: Ambulatory Visit | Attending: Internal Medicine | Admitting: Internal Medicine

## 2018-07-04 DIAGNOSIS — Z1231 Encounter for screening mammogram for malignant neoplasm of breast: Secondary | ICD-10-CM

## 2018-08-13 DIAGNOSIS — E2839 Other primary ovarian failure: Secondary | ICD-10-CM | POA: Diagnosis not present

## 2018-08-13 DIAGNOSIS — E119 Type 2 diabetes mellitus without complications: Secondary | ICD-10-CM | POA: Diagnosis not present

## 2018-08-13 DIAGNOSIS — E039 Hypothyroidism, unspecified: Secondary | ICD-10-CM | POA: Diagnosis not present

## 2018-08-13 DIAGNOSIS — Z780 Asymptomatic menopausal state: Secondary | ICD-10-CM | POA: Diagnosis not present

## 2018-08-13 DIAGNOSIS — I10 Essential (primary) hypertension: Secondary | ICD-10-CM | POA: Diagnosis not present

## 2018-08-13 DIAGNOSIS — N951 Menopausal and female climacteric states: Secondary | ICD-10-CM | POA: Diagnosis not present

## 2018-08-27 DIAGNOSIS — Z23 Encounter for immunization: Secondary | ICD-10-CM | POA: Diagnosis not present

## 2018-08-27 DIAGNOSIS — I10 Essential (primary) hypertension: Secondary | ICD-10-CM | POA: Diagnosis not present

## 2018-08-27 DIAGNOSIS — L02222 Furuncle of back [any part, except buttock]: Secondary | ICD-10-CM | POA: Diagnosis not present

## 2018-08-27 DIAGNOSIS — E119 Type 2 diabetes mellitus without complications: Secondary | ICD-10-CM | POA: Diagnosis not present

## 2018-10-11 DIAGNOSIS — Z13820 Encounter for screening for osteoporosis: Secondary | ICD-10-CM | POA: Diagnosis not present

## 2018-10-11 DIAGNOSIS — I10 Essential (primary) hypertension: Secondary | ICD-10-CM | POA: Diagnosis not present

## 2018-10-11 DIAGNOSIS — Z139 Encounter for screening, unspecified: Secondary | ICD-10-CM | POA: Diagnosis not present

## 2018-10-11 DIAGNOSIS — E119 Type 2 diabetes mellitus without complications: Secondary | ICD-10-CM | POA: Diagnosis not present

## 2018-10-15 ENCOUNTER — Other Ambulatory Visit (HOSPITAL_COMMUNITY): Payer: Self-pay | Admitting: Nurse Practitioner

## 2018-10-15 DIAGNOSIS — Z780 Asymptomatic menopausal state: Secondary | ICD-10-CM

## 2018-10-16 ENCOUNTER — Encounter: Payer: Self-pay | Admitting: Gastroenterology

## 2018-10-16 ENCOUNTER — Telehealth: Payer: Self-pay | Admitting: Gastroenterology

## 2018-10-16 NOTE — Telephone Encounter (Signed)
Noted  

## 2018-10-16 NOTE — Telephone Encounter (Signed)
Patient stated she is not having any GI problems right now.  However she would like to establish care here with Dr. Oneida Alar  She is requesting her records from [facility] to be faxed to our office.  We will schedule an appointment for the patient to establish care and a provider to review her GI records.

## 2018-10-16 NOTE — Telephone Encounter (Addendum)
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 SUBSEQUENT TCS IN 2011 HYPERPLASTIC POLYP. BELIEVES LAST TCS WAS IN 2016 OR 2017. WANTS TO ESTABLISH CARE WITH A LOCAL GI DOCTOR.  OBTAIN TCS REPORTS FROM [facility] WITHIN LAST 5 YEARS AND PATH REPORT FROM [facility] 2005-2007. Pt should have a colonoscopy every 5 years UNTIL THE AGE OF 77 DUE TO HISTORY OF ADVANCED POLYP BEING REMOVED IN 2006 AT AGE 68. YOUR SISTERS, BROTHERS, CHILDREN, AND PARENTS NEED TO HAVE A COLONOSCOPY STARTING AT THE AGE OF 40. HAS TWO DAUGHTERS.     PT ALSO HAD QUESTIONS ABOUT FIBER SUPPLEMENTS. SHE HAS BEEN USING THEM FOR YEARS TO HAVE A REGULAR BOWEL MOVEMENT. OK TO CONTINUE TO USE. EXPLAINED BENEFITS OF HIGH FIBER DIET TO INCLUDE REDUCED RISK OF COLON CANCER.  PT MAY CALL WITH QUESTIONS OR FOR AN APPT.

## 2018-10-22 ENCOUNTER — Ambulatory Visit (HOSPITAL_COMMUNITY)
Admission: RE | Admit: 2018-10-22 | Discharge: 2018-10-22 | Disposition: A | Discharge status: Home / Self Care | Payer: Medicare Other | Source: Ambulatory Visit | Attending: Nurse Practitioner | Admitting: Nurse Practitioner

## 2018-10-22 DIAGNOSIS — Z780 Asymptomatic menopausal state: Secondary | ICD-10-CM

## 2018-10-22 DIAGNOSIS — Z13820 Encounter for screening for osteoporosis: Secondary | ICD-10-CM | POA: Diagnosis not present

## 2018-10-25 ENCOUNTER — Ambulatory Visit (INDEPENDENT_AMBULATORY_CARE_PROVIDER_SITE_OTHER): Payer: Medicare Other | Admitting: Otolaryngology

## 2018-10-25 DIAGNOSIS — R42 Dizziness and giddiness: Secondary | ICD-10-CM

## 2018-10-25 DIAGNOSIS — H903 Sensorineural hearing loss, bilateral: Secondary | ICD-10-CM

## 2018-10-25 NOTE — Telephone Encounter (Signed)
I mailed release of records to patient and explained that we would need those records from [facility] prior to her OV with [place].

## 2018-11-01 DIAGNOSIS — R42 Dizziness and giddiness: Secondary | ICD-10-CM | POA: Diagnosis not present

## 2018-11-01 DIAGNOSIS — H903 Sensorineural hearing loss, bilateral: Secondary | ICD-10-CM | POA: Diagnosis not present

## 2018-11-07 DIAGNOSIS — G43809 Other migraine, not intractable, without status migrainosus: Secondary | ICD-10-CM | POA: Diagnosis not present

## 2018-11-07 DIAGNOSIS — R42 Dizziness and giddiness: Secondary | ICD-10-CM | POA: Diagnosis not present

## 2018-11-07 DIAGNOSIS — E039 Hypothyroidism, unspecified: Secondary | ICD-10-CM | POA: Diagnosis not present

## 2018-11-07 DIAGNOSIS — N951 Menopausal and female climacteric states: Secondary | ICD-10-CM | POA: Diagnosis not present

## 2018-11-07 DIAGNOSIS — I10 Essential (primary) hypertension: Secondary | ICD-10-CM | POA: Diagnosis not present

## 2018-11-08 ENCOUNTER — Telehealth: Payer: Self-pay | Admitting: Gastroenterology

## 2018-11-08 NOTE — Telephone Encounter (Signed)
Noted  

## 2018-11-08 NOTE — Telephone Encounter (Signed)
Pt is still trying to get her records released to [place]. She is going to call [facility] and follow up with them about getting records sent to [place] prior to her OV on 4/1 with [facility]

## 2018-11-08 NOTE — Telephone Encounter (Signed)
Pt called me back to say that she called [facility] and they said they faxed her records to [place] and we should have them. SF, do you have those records with you for review? I told patient that I would follow up with you and let her know in case we need to have those records faxed again.

## 2018-11-13 DIAGNOSIS — R42 Dizziness and giddiness: Secondary | ICD-10-CM | POA: Diagnosis not present

## 2018-11-14 NOTE — Telephone Encounter (Signed)
I called patient to let her know the records from [facility] are here and SF is reviewing them.

## 2018-11-14 NOTE — Telephone Encounter (Signed)
PLEASE CALL PT. We received the records today and I WILL REVIEW THEM.

## 2018-11-19 DIAGNOSIS — B351 Tinea unguium: Secondary | ICD-10-CM | POA: Diagnosis not present

## 2018-12-04 DIAGNOSIS — R42 Dizziness and giddiness: Secondary | ICD-10-CM | POA: Diagnosis not present

## 2018-12-10 DIAGNOSIS — R05 Cough: Secondary | ICD-10-CM | POA: Diagnosis not present

## 2018-12-10 DIAGNOSIS — R509 Fever, unspecified: Secondary | ICD-10-CM | POA: Diagnosis not present

## 2018-12-20 ENCOUNTER — Encounter: Payer: Self-pay | Admitting: *Deleted

## 2018-12-24 DIAGNOSIS — E039 Hypothyroidism, unspecified: Secondary | ICD-10-CM | POA: Diagnosis not present

## 2018-12-24 DIAGNOSIS — I10 Essential (primary) hypertension: Secondary | ICD-10-CM | POA: Diagnosis not present

## 2018-12-24 DIAGNOSIS — E2839 Other primary ovarian failure: Secondary | ICD-10-CM | POA: Diagnosis not present

## 2018-12-24 DIAGNOSIS — R5383 Other fatigue: Secondary | ICD-10-CM | POA: Diagnosis not present

## 2018-12-26 ENCOUNTER — Ambulatory Visit: Payer: Medicare Other | Admitting: Gastroenterology

## 2019-01-01 ENCOUNTER — Encounter: Payer: Self-pay | Admitting: Gastroenterology

## 2019-01-16 DIAGNOSIS — M545 Low back pain: Secondary | ICD-10-CM | POA: Diagnosis not present

## 2019-01-16 DIAGNOSIS — M9902 Segmental and somatic dysfunction of thoracic region: Secondary | ICD-10-CM | POA: Diagnosis not present

## 2019-01-16 DIAGNOSIS — G43009 Migraine without aura, not intractable, without status migrainosus: Secondary | ICD-10-CM | POA: Diagnosis not present

## 2019-01-16 DIAGNOSIS — M9903 Segmental and somatic dysfunction of lumbar region: Secondary | ICD-10-CM | POA: Diagnosis not present

## 2019-01-16 DIAGNOSIS — M9901 Segmental and somatic dysfunction of cervical region: Secondary | ICD-10-CM | POA: Diagnosis not present

## 2019-01-16 DIAGNOSIS — M9905 Segmental and somatic dysfunction of pelvic region: Secondary | ICD-10-CM | POA: Diagnosis not present

## 2019-01-21 DIAGNOSIS — M9903 Segmental and somatic dysfunction of lumbar region: Secondary | ICD-10-CM | POA: Diagnosis not present

## 2019-01-21 DIAGNOSIS — M9901 Segmental and somatic dysfunction of cervical region: Secondary | ICD-10-CM | POA: Diagnosis not present

## 2019-01-21 DIAGNOSIS — M545 Low back pain: Secondary | ICD-10-CM | POA: Diagnosis not present

## 2019-01-21 DIAGNOSIS — M9902 Segmental and somatic dysfunction of thoracic region: Secondary | ICD-10-CM | POA: Diagnosis not present

## 2019-01-21 DIAGNOSIS — G43009 Migraine without aura, not intractable, without status migrainosus: Secondary | ICD-10-CM | POA: Diagnosis not present

## 2019-01-21 DIAGNOSIS — M9905 Segmental and somatic dysfunction of pelvic region: Secondary | ICD-10-CM | POA: Diagnosis not present

## 2019-01-28 DIAGNOSIS — M545 Low back pain: Secondary | ICD-10-CM | POA: Diagnosis not present

## 2019-01-28 DIAGNOSIS — M9905 Segmental and somatic dysfunction of pelvic region: Secondary | ICD-10-CM | POA: Diagnosis not present

## 2019-01-28 DIAGNOSIS — M9901 Segmental and somatic dysfunction of cervical region: Secondary | ICD-10-CM | POA: Diagnosis not present

## 2019-01-28 DIAGNOSIS — G43009 Migraine without aura, not intractable, without status migrainosus: Secondary | ICD-10-CM | POA: Diagnosis not present

## 2019-01-28 DIAGNOSIS — M9902 Segmental and somatic dysfunction of thoracic region: Secondary | ICD-10-CM | POA: Diagnosis not present

## 2019-01-28 DIAGNOSIS — M9903 Segmental and somatic dysfunction of lumbar region: Secondary | ICD-10-CM | POA: Diagnosis not present

## 2019-02-04 DIAGNOSIS — M9901 Segmental and somatic dysfunction of cervical region: Secondary | ICD-10-CM | POA: Diagnosis not present

## 2019-02-04 DIAGNOSIS — M9903 Segmental and somatic dysfunction of lumbar region: Secondary | ICD-10-CM | POA: Diagnosis not present

## 2019-02-04 DIAGNOSIS — M9905 Segmental and somatic dysfunction of pelvic region: Secondary | ICD-10-CM | POA: Diagnosis not present

## 2019-02-04 DIAGNOSIS — M545 Low back pain: Secondary | ICD-10-CM | POA: Diagnosis not present

## 2019-02-04 DIAGNOSIS — G43009 Migraine without aura, not intractable, without status migrainosus: Secondary | ICD-10-CM | POA: Diagnosis not present

## 2019-02-04 DIAGNOSIS — M9902 Segmental and somatic dysfunction of thoracic region: Secondary | ICD-10-CM | POA: Diagnosis not present

## 2019-02-21 ENCOUNTER — Telehealth: Payer: Self-pay | Admitting: Gastroenterology

## 2019-02-21 ENCOUNTER — Encounter: Payer: Self-pay | Admitting: Gastroenterology

## 2019-02-21 NOTE — Telephone Encounter (Signed)
PLEASE CALL PT. I PERSONALLY REVIEWED THE TCS & PATH REPORT. HER NEXT TCS SHOUL DBE FEB 2022.

## 2019-02-25 NOTE — Telephone Encounter (Signed)
REVIEWED-NO ADDITIONAL RECOMMENDATIONS. 

## 2019-02-25 NOTE — Telephone Encounter (Signed)
Pt is aware and will keep apt with Dr. Oneida Alar on 03/27/2019 @ 9:30 Am to establish GI care.

## 2019-03-04 DIAGNOSIS — M545 Low back pain: Secondary | ICD-10-CM | POA: Diagnosis not present

## 2019-03-04 DIAGNOSIS — M9903 Segmental and somatic dysfunction of lumbar region: Secondary | ICD-10-CM | POA: Diagnosis not present

## 2019-03-04 DIAGNOSIS — M9901 Segmental and somatic dysfunction of cervical region: Secondary | ICD-10-CM | POA: Diagnosis not present

## 2019-03-04 DIAGNOSIS — M9905 Segmental and somatic dysfunction of pelvic region: Secondary | ICD-10-CM | POA: Diagnosis not present

## 2019-03-04 DIAGNOSIS — M9902 Segmental and somatic dysfunction of thoracic region: Secondary | ICD-10-CM | POA: Diagnosis not present

## 2019-03-04 DIAGNOSIS — G43009 Migraine without aura, not intractable, without status migrainosus: Secondary | ICD-10-CM | POA: Diagnosis not present

## 2019-03-27 ENCOUNTER — Other Ambulatory Visit: Payer: Self-pay

## 2019-03-27 ENCOUNTER — Ambulatory Visit (INDEPENDENT_AMBULATORY_CARE_PROVIDER_SITE_OTHER): Payer: Medicare Other | Admitting: Gastroenterology

## 2019-03-27 ENCOUNTER — Encounter: Payer: Self-pay | Admitting: Gastroenterology

## 2019-03-27 DIAGNOSIS — D126 Benign neoplasm of colon, unspecified: Secondary | ICD-10-CM | POA: Diagnosis not present

## 2019-03-27 DIAGNOSIS — K5900 Constipation, unspecified: Secondary | ICD-10-CM | POA: Insufficient documentation

## 2019-03-27 DIAGNOSIS — K5901 Slow transit constipation: Secondary | ICD-10-CM

## 2019-03-27 NOTE — Progress Notes (Signed)
Subjective:    Patient ID: Megan Hill, female    DOB: [birth year], 68 y.o.   MRN: [id number]  Doree Albee, MD  HPI HAS TO TAKE METAMUCIL TO HAVE A BM. MOST OF HER LIFE SHE HAS HAD ISSUES. DRINKS WATER: YES. EAT FIBER: LOST OF VEGGIES. BMs: DAILY AND IF DOESN'T GO SHE'S MISERABLE. OFTEN DOESN'T FEEL THE URGE TO HAVE A BM. SURGEON FIXED HER SIGMOID LOOP DURING HER HYSTERECTOMY. HASN'T TRIED Rx MEDS. HAS HERBAL TEA THAT WORKS TOO WELL.  LAST TCS AGE 68(AGE 53). AFTER COLON SURGERY Q1YR THEN Q2YRS THEN 5 YRS. METFORMIN CAUSED DIARRHEA. TO CONTROL HAs, PRACTICES YOGA, EXERCISES, AND CHIROPRACTOR.  PT DENIES FEVER, CHILLS, HEMATOCHEZIA, HEMATEMESIS, nausea, vomiting, melena, CHEST PAIN, SHORTNESS OF BREATH,  CHANGE IN BOWEL IN HABITS, abdominal pain, problems swallowing, problems with sedation, OR heartburn or indigestion.  Past Medical History:  Diagnosis Date  . Colon adenomas    JUL 2007, MAR 2006  . Diabetes mellitus without complication ([place])   . Diverticulitis   . Headache   . Hypertension   . Thyroid disease   . Tubulovillous adenoma of colon 03/2006   Past Surgical History:  Procedure Laterality Date  . ABDOMINAL HYSTERECTOMY    . APPENDECTOMY    . COLON SURGERY    . COLONOSCOPY  10/2015   NL ANASTOMOSIS, HYPERPLASTIC POLYP  . TONSILLECTOMY     Allergies  Allergen Reactions  . Penicillins        Current Outpatient Medications  Medication Sig    . APPLE CIDER VINEGAR PO Take by mouth. 450mg  4 tablets twice daily    . Ascorbic Acid (VITAMIN C) 1000 MG tablet Take 2,000 mg by mouth 2 (two) times a day.     Marland Kitchen aspirin EC 81 MG tablet Take 81 mg by mouth daily.    . Cholecalciferol (VITAMIN D3) 5000 units CAPS Take 5,000 Units by mouth 2 (two) times a day.     . Coenzyme Q10 (CO Q-10) 200 MG CAPS Take 200 mg by mouth daily.    . Cyanocobalamin (VITAMIN B-12) 2500 MCG SUBL Take by mouth daily.    Marland Kitchen BENADRYL 25 MG tablet Take 25 mg QHS TO PREVENT HAs    .  estradiol (ESTRACE) 1 MG tablet Take 1.5 mg by mouth daily.    . Garlic 7829 MG CAPS Take 1 capsule by mouth daily.    Marland Kitchen HYZAAR 50-12.5 MG per tablet Take 1 tablet by mouth daily.    . Magnesium 400 MG TABS Take 1 tablet by mouth daily.    . Melatonin 1 MG TABS Take 1 tablet by mouth at bedtime.    .      . PROMETRIUM) 200 MG capsule Take 400 mg by mouth daily.    Marland Kitchen pyridOXINE (VITAMIN B-6) 100 MG tablet Take 100 mg by mouth daily.    . TESTOSTERONE CYPIONATE IM Inject 0.2 mLs into the muscle once a week.    . thyroid (ARMOUR) 30 MG tablet Take 30 mg by mouth daily. Takes in PM    . thyroid (NP THYROID) 90 MG tablet Take 90 mg by mouth daily. Takes in AM    . Vitamin A 2400 MCG (8000 UT) TABS Take 1 tablet by mouth daily.    Marland Kitchen loratadine (CLARITIN) 10 MG tablet Take 10 mg by mouth daily.    . Tetrahydrozoline-PEG  Apply 2 drops to eye daily as needed (DRYNESS).     Family History  Problem Relation  Age of Onset  . Colon polyps Father        "FULL OF POLYPS"  . Colon cancer Paternal Aunt   . Cervical cancer Daughter    Social History   Socioeconomic History  . Marital status: Married    Spouse name: Not on file  . Number of children: Not on file  . Years of education: Not on file  . Highest education level: Not on file  Occupational History  . Not on file  Social Needs  . Financial resource strain: Not on file  . Food insecurity    Worry: Not on file    Inability: Not on file  . Transportation needs    Medical: Not on file    Non-medical: Not on file  Tobacco Use  . Smoking status: Never Smoker  . Smokeless tobacco: Never Used  Substance and Sexual Activity  . Alcohol use: No  . Drug use: No  . Sexual activity: Yes    Birth control/protection: Surgical  Lifestyle  . Physical activity    Days per week: Not on file    Minutes per session: Not on file  . Stress: Not on file  Relationships  . Social Herbalist on phone: Not on file    Gets together: Not on  file    Attends religious service: Not on file    Active member of club or organization: Not on file    Attends meetings of clubs or organizations: Not on file    Relationship status: Not on file  Other Topics Concern  . Not on file  Social History Narrative   [phone]). KIDS-2(BORN 1974, 76). OLDEST DAUGHTER WORKS IN [place]. HOBBIES: HOME SCHOOLS 20 YO [employer], [place], [employer], SEWS.    Review of Systems PER HPI OTHERWISE ALL SYSTEMS ARE NEGATIVE.    Objective:   Physical Exam Vitals signs reviewed.  Constitutional:      General: She is not in acute distress.    Appearance: She is well-developed.  HENT:     Head: Normocephalic and atraumatic.     Mouth/Throat:     Pharynx: No oropharyngeal exudate.  Eyes:     General: No scleral icterus.    Pupils: Pupils are equal, round, and reactive to light.  Neck:     Musculoskeletal: Normal range of motion and neck supple.  Cardiovascular:     Rate and Rhythm: Normal rate and regular rhythm.     Heart sounds: Normal heart sounds.  Pulmonary:     Effort: Pulmonary effort is normal. No respiratory distress.     Breath sounds: Normal breath sounds.  Abdominal:     General: Bowel sounds are normal. There is no distension.     Palpations: Abdomen is soft.     Tenderness: There is no abdominal tenderness.  Musculoskeletal:     Right lower leg: No edema.     Left lower leg: No edema.  Lymphadenopathy:     Cervical: No cervical adenopathy.  Neurological:     General: No focal deficit present.     Mental Status: She is alert and oriented to person, place, and time.  Psychiatric:     Comments: FLAT AFFECT, SLIGHTLY ANXIOUS MOOD       Assessment & Plan:

## 2019-03-27 NOTE — Assessment & Plan Note (Signed)
NO WARNING SIGNS/SYMPTOMS.  NEXT COLONOSCOPY IN FEB 2022.YOUR SISTERS, BROTHERS, CHILDREN, AND PARENTS NEED TO HAVE A COLONOSCOPY STARTING AT THE AGE OF 40 AND EVERY 5 YEARS. FOLLOW UP IN THE OFFICE WILL BE SCHEDULED WHEN NEEDED TO SCHEDULE ENDOSCOPY.

## 2019-03-27 NOTE — Progress Notes (Signed)
cc'd to pcp 

## 2019-03-27 NOTE — Assessment & Plan Note (Signed)
SYMPTOMS FAIRLY WELL CONTROLLED.  DRINK WATER TO KEEP YOUR URINE LIGHT YELLOW. FOLLOW A HIGH FIBER DIET. AVOID ITEMS THAT CAUSE BLOATING & GAS.  HANDOUT GIVEN. CONTINUE METAMUCIL TO PREVENT CONSTIPATION. USE HERBAL TEA 4 OZ WITH 4 OZ WATER TO AVOID CONSTIPATION. FOLLOW UP IN THE OFFICE WILL BE SCHEDULED WHEN NEEDED TO SCHEDULE ENDOSCOPY.

## 2019-03-27 NOTE — Patient Instructions (Signed)
DRINK WATER TO KEEP YOUR URINE LIGHT YELLOW.  FOLLOW A HIGH FIBER DIET. AVOID ITEMS THAT CAUSE BLOATING & GAS. SEE INFO BELOW.  CONTINUE METAMUCIL TO PREVENT CONSTIPATION.  USE HERBAL TEA 4 OZ WITH 4 OZ WATER TO AVOID CONSTIPATION.  NEXT COLONOSCOPY IN FEB 2022.YOUR SISTERS, BROTHERS, CHILDREN, AND PARENTS NEED TO HAVE A COLONOSCOPY STARTING AT THE AGE OF 40 AND EVERY 5 YEARS.  FOLLOW UP IN THE OFFICE WILL BE SCHEDULED WHEN NEEDED  TO SCHEDULE ENDOSCOPY.  High-Fiber Diet A high-fiber diet changes your normal diet to include more whole grains, legumes, fruits, and vegetables. Changes in the diet involve replacing refined carbohydrates with unrefined foods. The calorie level of the diet is essentially unchanged. The Dietary Reference Intake (recommended amount) for adult males is 38 grams per day. For adult females, it is 25 grams per day. Pregnant and lactating women should consume 28 grams of fiber per day. Fiber is the intact part of a plant that is not broken down during digestion. Functional fiber is fiber that has been isolated from the plant to provide a beneficial effect in the body.  PURPOSE  Increase stool bulk.   Ease and regulate bowel movements.   Lower cholesterol.   REDUCE RISK OF COLON CANCER  INDICATIONS THAT YOU NEED MORE FIBER  Constipation and hemorrhoids.   Uncomplicated diverticulosis (intestine condition) and irritable bowel syndrome.   Weight management.   As a protective measure against hardening of the arteries (atherosclerosis), diabetes, and cancer.   GUIDELINES FOR INCREASING FIBER IN THE DIET  Start adding fiber to the diet slowly. A gradual increase of about 5 more grams (2 slices of whole-wheat bread, 2 servings of most fruits or vegetables, or 1 bowl of high-fiber cereal) per day is best. Too rapid an increase in fiber may result in constipation, flatulence, and bloating.   Drink enough water and fluids to keep your urine clear or pale yellow.  Water, juice, or caffeine-free drinks are recommended. Not drinking enough fluid may cause constipation.   Eat a variety of high-fiber foods rather than one type of fiber.   Try to increase your intake of fiber through using high-fiber foods rather than fiber pills or supplements that contain small amounts of fiber.   The goal is to change the types of food eaten. Do not supplement your present diet with high-fiber foods, but replace foods in your present diet.   INCLUDE A VARIETY OF FIBER SOURCES  Replace refined and processed grains with whole grains, canned fruits with fresh fruits, and incorporate other fiber sources. White rice, white breads, and most bakery goods contain little or no fiber.   Brown whole-grain rice, buckwheat oats, and many fruits and vegetables are all good sources of fiber. These include: broccoli, Brussels sprouts, cabbage, cauliflower, beets, sweet potatoes, white potatoes (skin on), carrots, tomatoes, eggplant, squash, berries, fresh fruits, and dried fruits.   Cereals appear to be the richest source of fiber. Cereal fiber is found in whole grains and bran. Bran is the fiber-rich outer coat of cereal grain, which is largely removed in refining. In whole-grain cereals, the bran remains. In breakfast cereals, the largest amount of fiber is found in those with "bran" in their names. The fiber content is sometimes indicated on the label.   You may need to include additional fruits and vegetables each day.   In baking, for 1 cup white flour, you may use the following substitutions:   1 cup whole-wheat flour minus 2 tablespoons.  1/2 cup white flour plus 1/2 cup whole-wheat flour.

## 2019-04-10 ENCOUNTER — Ambulatory Visit: Payer: Medicare Other | Admitting: Gastroenterology

## 2019-04-25 DIAGNOSIS — R6882 Decreased libido: Secondary | ICD-10-CM | POA: Diagnosis not present

## 2019-04-25 DIAGNOSIS — E2839 Other primary ovarian failure: Secondary | ICD-10-CM | POA: Diagnosis not present

## 2019-04-25 DIAGNOSIS — I10 Essential (primary) hypertension: Secondary | ICD-10-CM | POA: Diagnosis not present

## 2019-04-25 DIAGNOSIS — E039 Hypothyroidism, unspecified: Secondary | ICD-10-CM | POA: Diagnosis not present

## 2019-04-25 DIAGNOSIS — R5383 Other fatigue: Secondary | ICD-10-CM | POA: Diagnosis not present

## 2019-04-25 DIAGNOSIS — E119 Type 2 diabetes mellitus without complications: Secondary | ICD-10-CM | POA: Diagnosis not present

## 2019-04-25 DIAGNOSIS — E559 Vitamin D deficiency, unspecified: Secondary | ICD-10-CM | POA: Diagnosis not present

## 2019-05-27 DIAGNOSIS — G43009 Migraine without aura, not intractable, without status migrainosus: Secondary | ICD-10-CM | POA: Diagnosis not present

## 2019-05-27 DIAGNOSIS — M9903 Segmental and somatic dysfunction of lumbar region: Secondary | ICD-10-CM | POA: Diagnosis not present

## 2019-05-27 DIAGNOSIS — M545 Low back pain: Secondary | ICD-10-CM | POA: Diagnosis not present

## 2019-05-27 DIAGNOSIS — M9901 Segmental and somatic dysfunction of cervical region: Secondary | ICD-10-CM | POA: Diagnosis not present

## 2019-05-27 DIAGNOSIS — M9905 Segmental and somatic dysfunction of pelvic region: Secondary | ICD-10-CM | POA: Diagnosis not present

## 2019-05-27 DIAGNOSIS — M9902 Segmental and somatic dysfunction of thoracic region: Secondary | ICD-10-CM | POA: Diagnosis not present

## 2019-06-11 ENCOUNTER — Other Ambulatory Visit (INDEPENDENT_AMBULATORY_CARE_PROVIDER_SITE_OTHER): Payer: Self-pay

## 2019-06-11 DIAGNOSIS — E2839 Other primary ovarian failure: Secondary | ICD-10-CM

## 2019-06-11 MED ORDER — PROGESTERONE MICRONIZED 200 MG PO CAPS: 400.0000 mg | ORAL_CAPSULE | Freq: Every day | ORAL | 3 refills | Status: DC

## 2019-06-14 DIAGNOSIS — M545 Low back pain: Secondary | ICD-10-CM | POA: Diagnosis not present

## 2019-06-14 DIAGNOSIS — M9901 Segmental and somatic dysfunction of cervical region: Secondary | ICD-10-CM | POA: Diagnosis not present

## 2019-06-14 DIAGNOSIS — M9902 Segmental and somatic dysfunction of thoracic region: Secondary | ICD-10-CM | POA: Diagnosis not present

## 2019-06-14 DIAGNOSIS — M9905 Segmental and somatic dysfunction of pelvic region: Secondary | ICD-10-CM | POA: Diagnosis not present

## 2019-06-14 DIAGNOSIS — M9903 Segmental and somatic dysfunction of lumbar region: Secondary | ICD-10-CM | POA: Diagnosis not present

## 2019-06-17 ENCOUNTER — Other Ambulatory Visit (INDEPENDENT_AMBULATORY_CARE_PROVIDER_SITE_OTHER): Payer: Self-pay | Admitting: Internal Medicine

## 2019-06-17 MED ORDER — BUDESONIDE-FORMOTEROL FUMARATE 160-4.5 MCG/ACT IN AERO: 2.0000 | INHALATION_SPRAY | Freq: Two times a day (BID) | RESPIRATORY_TRACT | 3 refills | Status: DC

## 2019-06-18 ENCOUNTER — Other Ambulatory Visit (INDEPENDENT_AMBULATORY_CARE_PROVIDER_SITE_OTHER): Payer: Self-pay | Admitting: Internal Medicine

## 2019-06-18 MED ORDER — FLUTICASONE-SALMETEROL 250-50 MCG/DOSE IN AEPB: 1.0000 | INHALATION_SPRAY | Freq: Two times a day (BID) | RESPIRATORY_TRACT | 3 refills | Status: DC

## 2019-06-21 DIAGNOSIS — M9903 Segmental and somatic dysfunction of lumbar region: Secondary | ICD-10-CM | POA: Diagnosis not present

## 2019-06-21 DIAGNOSIS — M9902 Segmental and somatic dysfunction of thoracic region: Secondary | ICD-10-CM | POA: Diagnosis not present

## 2019-06-21 DIAGNOSIS — M9905 Segmental and somatic dysfunction of pelvic region: Secondary | ICD-10-CM | POA: Diagnosis not present

## 2019-06-21 DIAGNOSIS — M9901 Segmental and somatic dysfunction of cervical region: Secondary | ICD-10-CM | POA: Diagnosis not present

## 2019-06-21 DIAGNOSIS — M545 Low back pain: Secondary | ICD-10-CM | POA: Diagnosis not present

## 2019-06-28 DIAGNOSIS — M25562 Pain in left knee: Secondary | ICD-10-CM | POA: Diagnosis not present

## 2019-06-28 DIAGNOSIS — M9901 Segmental and somatic dysfunction of cervical region: Secondary | ICD-10-CM | POA: Diagnosis not present

## 2019-06-28 DIAGNOSIS — M9903 Segmental and somatic dysfunction of lumbar region: Secondary | ICD-10-CM | POA: Diagnosis not present

## 2019-06-28 DIAGNOSIS — M545 Low back pain: Secondary | ICD-10-CM | POA: Diagnosis not present

## 2019-06-28 DIAGNOSIS — M9902 Segmental and somatic dysfunction of thoracic region: Secondary | ICD-10-CM | POA: Diagnosis not present

## 2019-06-28 DIAGNOSIS — M9905 Segmental and somatic dysfunction of pelvic region: Secondary | ICD-10-CM | POA: Diagnosis not present

## 2019-07-05 DIAGNOSIS — M545 Low back pain: Secondary | ICD-10-CM | POA: Diagnosis not present

## 2019-07-05 DIAGNOSIS — M9901 Segmental and somatic dysfunction of cervical region: Secondary | ICD-10-CM | POA: Diagnosis not present

## 2019-07-05 DIAGNOSIS — M25562 Pain in left knee: Secondary | ICD-10-CM | POA: Diagnosis not present

## 2019-07-05 DIAGNOSIS — M9903 Segmental and somatic dysfunction of lumbar region: Secondary | ICD-10-CM | POA: Diagnosis not present

## 2019-07-05 DIAGNOSIS — M9905 Segmental and somatic dysfunction of pelvic region: Secondary | ICD-10-CM | POA: Diagnosis not present

## 2019-07-05 DIAGNOSIS — M9902 Segmental and somatic dysfunction of thoracic region: Secondary | ICD-10-CM | POA: Diagnosis not present

## 2019-07-22 ENCOUNTER — Other Ambulatory Visit (HOSPITAL_COMMUNITY): Payer: Self-pay | Admitting: Internal Medicine

## 2019-07-22 DIAGNOSIS — Z1231 Encounter for screening mammogram for malignant neoplasm of breast: Secondary | ICD-10-CM

## 2019-07-31 ENCOUNTER — Ambulatory Visit (HOSPITAL_COMMUNITY): Payer: Medicare Other

## 2019-08-13 ENCOUNTER — Encounter (INDEPENDENT_AMBULATORY_CARE_PROVIDER_SITE_OTHER): Payer: Self-pay | Admitting: Internal Medicine

## 2019-08-13 ENCOUNTER — Ambulatory Visit (INDEPENDENT_AMBULATORY_CARE_PROVIDER_SITE_OTHER): Payer: Medicare Other | Admitting: Internal Medicine

## 2019-08-13 ENCOUNTER — Other Ambulatory Visit: Payer: Self-pay

## 2019-08-13 VITALS — BP 140/72 | HR 80 | Temp 99.5°F | Resp 18 | Ht 64.0 in | Wt 172.0 lb

## 2019-08-13 DIAGNOSIS — E559 Vitamin D deficiency, unspecified: Secondary | ICD-10-CM | POA: Diagnosis not present

## 2019-08-13 DIAGNOSIS — R5383 Other fatigue: Secondary | ICD-10-CM

## 2019-08-13 DIAGNOSIS — E119 Type 2 diabetes mellitus without complications: Secondary | ICD-10-CM | POA: Diagnosis not present

## 2019-08-13 DIAGNOSIS — E039 Hypothyroidism, unspecified: Secondary | ICD-10-CM | POA: Diagnosis not present

## 2019-08-13 DIAGNOSIS — I10 Essential (primary) hypertension: Secondary | ICD-10-CM

## 2019-08-13 DIAGNOSIS — E2839 Other primary ovarian failure: Secondary | ICD-10-CM

## 2019-08-13 DIAGNOSIS — R5381 Other malaise: Secondary | ICD-10-CM

## 2019-08-13 DIAGNOSIS — Z1159 Encounter for screening for other viral diseases: Secondary | ICD-10-CM

## 2019-08-13 HISTORY — DX: Other primary ovarian failure: E28.39

## 2019-08-13 HISTORY — DX: Hypothyroidism, unspecified: E03.9

## 2019-08-13 HISTORY — DX: Essential (primary) hypertension: I10

## 2019-08-13 NOTE — Progress Notes (Signed)
Metrics: Intervention Frequency ACO  Documented Smoking Status Yearly  Screened one or more times in 24 months  Cessation Counseling or  Active cessation medication Past 24 months  Past 24 months   Guideline developer: UpToDate (See UpToDate for funding source) Date Released: 2014       Wellness Office Visit  Subjective:  Patient ID: Megan Hill, female    DOB: 1951/07/31  Age: 68 y.o. MRN: [id number]  CC: This lady comes in for follow-up of her multiple medical problems including hypertension, hypothyroidism, prediabetes and obesity. HPI  She is frustrated that she cannot lose weight.  Also she has discontinued estradiol and progesterone as she felt that this was giving her side effects such as emotional lability, depression-like symptoms.  Since stopping these medications, she has not yet noticed any difference. She continues on antihypertensive therapy.  She denies any chest pain, dyspnea or palpitations. She has tolerated desiccated thyroid for hypothyroidism. Her diabetes thankfully has been in good control and is diet controlled.  Her last hemoglobin A1c was less than 6%. She continues on testosterone therapy albeit low-dose. Past Medical History:  Diagnosis Date  . Colon adenomas    JUL 2007, MAR 2006  . Diabetes mellitus without complication ([place])   . Diverticulitis   . Essential hypertension, benign 08/13/2019  . Headache   . Hypertension   . Hypothyroidism, adult 08/13/2019  . 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 Thyroid disease   . Tubulovillous adenoma of colon 03/2006      Family History  Problem Relation Age of Onset  . Colon polyps Father        "FULL OF POLYPS"  . Colon cancer Paternal Aunt   . Cervical cancer Daughter     Social History   Social History Narrative   [phone]). KIDS-2(BORN 1974, 76). OLDEST DAUGHTER WORKS IN [place]. HOBBIES: HOME SCHOOLS 79 YO GRANDSON, [employer], READS, SEWS. 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   Social History   Tobacco Use  . Smoking status: Never Smoker  . Smokeless tobacco: Never Used  Substance Use Topics  . Alcohol use: No    Current Meds  Medication Sig  . Ascorbic Acid (VITAMIN C) 1000 MG tablet Take 2,000 mg by mouth 2 (two) times a day.   Marland Kitchen aspirin EC 81 MG tablet Take 81 mg by mouth daily.  . Cholecalciferol (VITAMIN D3) 5000 units CAPS Take 5,000 Units by mouth 2 (two) times a day.   . Coenzyme Q10 (CO Q-10) 200 MG CAPS Take 200 mg by mouth daily.  . Cyanocobalamin (VITAMIN B-12) 2500 MCG SUBL Take by mouth daily.  . Garlic 0350 MG CAPS Take 1 capsule by mouth daily.  . hydrochlorothiazide (MICROZIDE) 12.5 MG capsule Take 12.5 mg by mouth daily.  Marland Kitchen loratadine (CLARITIN) 10 MG tablet Take 10 mg by mouth daily.  Marland Kitchen losartan (COZAAR) 50 MG tablet Take 50 mg by mouth daily.  . Magnesium 400 MG TABS Take 1 tablet by mouth daily.  . Melatonin 1 MG TABS Take 1 tablet by mouth at bedtime.  . NP THYROID 30 MG tablet Take 30 mg by mouth daily before lunch.  . pyridOXINE (VITAMIN B-6) 100 MG tablet Take 100 mg by mouth daily.  . SELENIUM PO Take 1 tablet by mouth daily.  . TESTOSTERONE CYPIONATE IM Inject 0.15 mLs into the muscle once a week.   . thyroid (NP THYROID) 90 MG tablet  Take 90 mg by mouth daily. Takes in AM  . Vitamin A 2400 MCG (8000 UT) TABS Take 1 tablet by mouth daily.  . [DISCONTINUED] thyroid (ARMOUR) 30 MG tablet Take 30 mg by mouth daily. Takes in PM    ntical Hormones  Testosterone therapy is being used off label for symptoms of testosterone deficiency and benefits that it produces based on several studies.  These benefits include decreasing body fat, increasing in lean muscle mass and increasing in bone density.  There is improvement of memory, cognition.  There is improvement in exercise tolerance and endurance.  Testosterone therapy has also been shown to be protective  against coronary artery disease, cerebrovascular disease, diabetes, hypertension and degenerative joint disease. I have discussed with the patient the FDA warnings regarding testosterone therapy, benefits and side effects and modes of administration as well as monitoring blood levels and side effects  on a regular basis The patient is agreeable that testosterone therapy should be an integral part of his/her wellness,quality of life and prevention of chronic disease.  Objective:   Today's Vitals: BP 140/72 (BP Location: Left Arm, Patient Position: Sitting, Cuff Size: Normal)   Pulse 80   Temp 99.5 F (37.5 C) (Temporal)   Resp 18   Ht '5\' 4"'$  (1.626 m)   Wt 172 lb (78 kg)   SpO2 96% Comment: with a mask on.  BMI 29.52 kg/m  Vitals with BMI 08/13/2019 03/27/2019 07/25/2017  Height '5\' 4"'$  '5\' 4"'$  -  Weight 172 lbs 168 lbs 6 oz -  BMI 09.47 09.62 -  Systolic [phone]  Diastolic 72 72 63  Pulse 80 82 74     Physical Exam       Assessment   1. Malaise and fatigue   2. Hypothyroidism, adult   3. Essential hypertension, benign   4. Diabetes mellitus without complication ([place])   5. Primary ovarian failure   6. Vitamin D deficiency disease   7. Encounter for hepatitis C screening test for low risk patient       Tests ordered Orders Placed This Encounter  Procedures  . CBC  . CMP with eGFR([facility])  . Hemoglobin A1c  . T3, Free  . TSH  . Vitamin D, 25-hydroxy  . Hep C Antibody     Plan: 1. Blood work is ordered above. 2. She will continue with desiccated NP thyroid as before and we will see if we need to adjust the dose. 3. She will continue with antihypertensive therapy as before. 4. I spent most of the visit discussing nutrition and the concept of prolonged intermittent fasting to see if she can lose further weight.  She has already tried a whole food plant-based diet and she was somewhat successful with this.  She is concerned that her sugars are all over the place at  the present time so I hope her a A1c is acceptable and she does not need medications. 5. Further recommendations will depend on blood results and I will see her in 3 months time for follow-up. 6. Today I spent 40 to 45 minutes with patient face-to-face, more than 50% of time was involved in discussing her overall health and nutrition above.   No orders of the defined types were placed in this encounter.   Doree Albee, MD

## 2019-08-14 ENCOUNTER — Ambulatory Visit (HOSPITAL_COMMUNITY)
Admission: RE | Admit: 2019-08-14 | Discharge: 2019-08-14 | Disposition: A | Discharge status: Home / Self Care | Payer: Medicare Other | Source: Ambulatory Visit | Attending: Internal Medicine | Admitting: Internal Medicine

## 2019-08-14 DIAGNOSIS — Z1231 Encounter for screening mammogram for malignant neoplasm of breast: Secondary | ICD-10-CM | POA: Diagnosis not present

## 2019-08-14 LAB — VITAMIN D 25 HYDROXY (VIT D DEFICIENCY, FRACTURES): Vit D, 25-Hydroxy: 62 ng/mL (ref 30–100)

## 2019-08-14 LAB — CBC
HCT: 44.9 % (ref 35.0–45.0)
Hemoglobin: 15.1 g/dL (ref 11.7–15.5)
MCH: 31.1 pg (ref 27.0–33.0)
MCHC: 33.6 g/dL (ref 32.0–36.0)
MCV: 92.4 fL (ref 80.0–100.0)
MPV: 10.9 fL (ref 7.5–12.5)
Platelets: 302 10*3/uL (ref 140–400)
RBC: 4.86 10*6/uL (ref 3.80–5.10)
RDW: 11.8 % (ref 11.0–15.0)
WBC: 7.7 10*3/uL (ref 3.8–10.8)

## 2019-08-14 LAB — COMPLETE METABOLIC PANEL WITH GFR
AG Ratio: 1.7 (calc) (ref 1.0–2.5)
ALT: 33 U/L — ABNORMAL HIGH (ref 6–29)
AST: 24 U/L (ref 10–35)
Albumin: 4.5 g/dL (ref 3.6–5.1)
Alkaline phosphatase (APISO): 34 U/L — ABNORMAL LOW (ref 37–153)
BUN: 20 mg/dL (ref 7–25)
CO2: 23 mmol/L (ref 20–32)
Calcium: 9.8 mg/dL (ref 8.6–10.4)
Chloride: 104 mmol/L (ref 98–110)
Creat: 0.89 mg/dL (ref 0.50–0.99)
GFR, Est African American: 78 mL/min/{1.73_m2} (ref >=60)
GFR, Est Non African American: 68 mL/min/{1.73_m2} (ref >=60)
Globulin: 2.6 g/dL (calc) (ref 1.9–3.7)
Glucose, Bld: 118 mg/dL — ABNORMAL HIGH (ref 65–99)
Potassium: 4.1 mmol/L (ref 3.5–5.3)
Sodium: 140 mmol/L (ref 135–146)
Total Bilirubin: 0.7 mg/dL (ref 0.2–1.2)
Total Protein: 7.1 g/dL (ref 6.1–8.1)

## 2019-08-14 LAB — HEMOGLOBIN A1C
Hgb A1c MFr Bld: 6.3 % of total Hgb — ABNORMAL HIGH (ref <5.7)
Mean Plasma Glucose: 134 (calc)
eAG (mmol/L): 7.4 (calc)

## 2019-08-14 LAB — TSH: TSH: 1.07 mIU/L (ref 0.40–4.50)

## 2019-08-14 LAB — T3, FREE: T3, Free: 3 pg/mL (ref 2.3–4.2)

## 2019-08-14 LAB — HEPATITIS C ANTIBODY
Hepatitis C Ab: NONREACTIVE
SIGNAL TO CUT-OFF: 0.03 (ref <1.00)

## 2019-08-15 ENCOUNTER — Other Ambulatory Visit (INDEPENDENT_AMBULATORY_CARE_PROVIDER_SITE_OTHER): Payer: Self-pay | Admitting: Internal Medicine

## 2019-08-26 ENCOUNTER — Encounter (INDEPENDENT_AMBULATORY_CARE_PROVIDER_SITE_OTHER): Payer: Self-pay | Admitting: Internal Medicine

## 2019-08-26 ENCOUNTER — Other Ambulatory Visit (INDEPENDENT_AMBULATORY_CARE_PROVIDER_SITE_OTHER): Payer: Self-pay | Admitting: Internal Medicine

## 2019-08-26 ENCOUNTER — Telehealth (INDEPENDENT_AMBULATORY_CARE_PROVIDER_SITE_OTHER): Payer: Self-pay

## 2019-08-26 MED ORDER — PROGESTERONE MICRONIZED 200 MG PO CAPS: 400.0000 mg | ORAL_CAPSULE | Freq: Every day | ORAL | 3 refills | Status: DC

## 2019-08-26 MED ORDER — ESTRADIOL 1 MG PO TABS: 1.0000 mg | ORAL_TABLET | Freq: Every day | ORAL | 3 refills | Status: DC

## 2019-08-26 NOTE — Telephone Encounter (Signed)
BHRT order refills are completed.

## 2019-08-29 ENCOUNTER — Other Ambulatory Visit (INDEPENDENT_AMBULATORY_CARE_PROVIDER_SITE_OTHER): Payer: Self-pay | Admitting: Internal Medicine

## 2019-08-29 ENCOUNTER — Encounter (INDEPENDENT_AMBULATORY_CARE_PROVIDER_SITE_OTHER): Payer: Self-pay | Admitting: Internal Medicine

## 2019-08-29 MED ORDER — THYROID 60 MG PO TABS: 60.0000 mg | ORAL_TABLET | Freq: Every day | ORAL | 3 refills | Status: DC

## 2019-09-16 ENCOUNTER — Other Ambulatory Visit (INDEPENDENT_AMBULATORY_CARE_PROVIDER_SITE_OTHER): Payer: Self-pay | Admitting: Internal Medicine

## 2019-09-16 ENCOUNTER — Telehealth (INDEPENDENT_AMBULATORY_CARE_PROVIDER_SITE_OTHER): Payer: Self-pay

## 2019-09-16 MED ORDER — THYROID 90 MG PO TABS: 90.0000 mg | ORAL_TABLET | Freq: Every day | ORAL | 3 refills | Status: DC

## 2019-09-16 NOTE — Telephone Encounter (Signed)
Patient is requesting a refill for Thyroid NP 90mg  called to [facility] - she is out today.

## 2019-10-01 ENCOUNTER — Ambulatory Visit (INDEPENDENT_AMBULATORY_CARE_PROVIDER_SITE_OTHER): Payer: Medicare Other | Admitting: Internal Medicine

## 2019-10-01 ENCOUNTER — Encounter (INDEPENDENT_AMBULATORY_CARE_PROVIDER_SITE_OTHER): Payer: Self-pay | Admitting: Internal Medicine

## 2019-10-01 ENCOUNTER — Other Ambulatory Visit: Payer: Self-pay

## 2019-10-01 VITALS — BP 140/70 | HR 85 | Ht 64.0 in | Wt 174.0 lb

## 2019-10-01 DIAGNOSIS — R5383 Other fatigue: Secondary | ICD-10-CM | POA: Diagnosis not present

## 2019-10-01 DIAGNOSIS — R002 Palpitations: Secondary | ICD-10-CM | POA: Diagnosis not present

## 2019-10-01 DIAGNOSIS — R5381 Other malaise: Secondary | ICD-10-CM

## 2019-10-01 DIAGNOSIS — E039 Hypothyroidism, unspecified: Secondary | ICD-10-CM

## 2019-10-01 NOTE — Progress Notes (Signed)
14Metrics: Intervention Frequency ACO  Documented Smoking Status Yearly  Screened one or more times in 24 months  Cessation Counseling or  Active cessation medication Past 24 months  Past 24 months   Guideline developer: UpToDate (See UpToDate for funding source) Date Released: 2014       Wellness Office Visit  Subjective:  Patient ID: Megan Hill, female    DOB: 12-02-1950  Age: 69 y.o. MRN: [id number]  CC: This lady comes in for an acute visit with episode of palpitations. HPI  Yesterday, she noticed a 30-minute episode of rapid palpitations, often irregular with a heart rate in the region of 150.  The palpitations were associated with some upper chest/neck tightness.  She did not go to the emergency room and the palpitations eventually subsided. About a month ago, I adjusted her desiccated NP thyroid dose higher. Past Medical History:  Diagnosis Date  . Colon adenomas    JUL 2007, MAR 2006  . Diabetes mellitus without complication ([place])   . Diverticulitis   . Essential hypertension, benign 08/13/2019  . Headache   . Hypertension   . Hypothyroidism, adult 08/13/2019  . 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 Thyroid disease   . Tubulovillous adenoma of colon 03/2006      Family History  Problem Relation Age of Onset  . Colon polyps Father        "FULL OF POLYPS"  . Colon cancer Paternal Aunt   . Cervical cancer Daughter     Social History   Social History Narrative   [phone]). KIDS-2(BORN 1974, 76). OLDEST DAUGHTER WORKS IN [place]. 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   Social History   Tobacco Use  . Smoking status: Never Smoker  . Smokeless tobacco: Never Used  Substance Use Topics  . Alcohol use: No    Current Meds  Medication Sig  . Ascorbic Acid (VITAMIN C) 1000 MG tablet Take 2,000 mg by mouth 2 (two)  times a day.   Marland Kitchen aspirin EC 81 MG tablet Take 81 mg by mouth daily.  . Cholecalciferol (VITAMIN D3) 5000 units CAPS Take 5,000 Units by mouth 2 (two) times a day.   . Coenzyme Q10 (CO Q-10) 200 MG CAPS Take 200 mg by mouth daily.  . Cyanocobalamin (VITAMIN B-12) 2500 MCG SUBL Take by mouth daily.  Marland Kitchen estradiol (ESTRACE) 1 MG tablet Take 1 tablet (1 mg total) by mouth daily.  . Garlic [id number] MG CAPS Take 1 capsule by mouth daily.  . hydrochlorothiazide (MICROZIDE) 12.5 MG capsule Take 12.5 mg by mouth daily.  Marland Kitchen loratadine (CLARITIN) 10 MG tablet Take 10 mg by mouth daily.  Marland Kitchen losartan (COZAAR) 50 MG tablet Take 50 mg by mouth daily.  . Magnesium 400 MG TABS Take 1 tablet by mouth daily.  . Melatonin 1 MG TABS Take 1 tablet by mouth at bedtime.  . progesterone (PROMETRIUM) 200 MG capsule Take 2 capsules (400 mg total) by mouth daily.  . SELENIUM PO Take 1 tablet by mouth daily.  . TESTOSTERONE CYPIONATE IM Inject 0.15 mLs into the muscle once a week.   . thyroid (NP THYROID) 60 MG tablet Take 1 tablet (60 mg total) by mouth daily before breakfast.  . thyroid (NP THYROID) 90 MG tablet Take 1 tablet (90 mg total) by mouth daily. Takes in AM  . Vitamin A 2400 MCG (8000 UT)  TABS Take 1 tablet by mouth daily.      Objective:   Today's Vitals: BP 140/70   Pulse 85   Ht 5\' 4"  (1.626 m)   Wt 174 lb (78.9 kg)   SpO2 97%   BMI 29.87 kg/m  Vitals with BMI 10/01/2019 08/13/2019 03/27/2019  Height 5\' 4"  5\' 4"  5\' 4"   Weight 174 lbs 172 lbs 168 lbs 6 oz  BMI 29.85 [id number] [id number]  Systolic [id number]  Diastolic 70 72 72  Pulse 85 80 82     Physical Exam   She looks systemically well at rest without any distress.  She does not have a resting tachycardia and heart sounds are present and regular with no gallop rhythm and no evidence of atrial fibrillation clinically.  Lung fields are clear.  She is alert and orientated without any focal neurological signs.   Assessment   1. Hypothyroidism, adult     2. Malaise and fatigue   3. Palpitations       Tests ordered Orders Placed This Encounter  Procedures  . T3, Free  . TSH  . Ambulatory referral to Cardiology     Plan: 1. An ECG was done in the office which shows NSR ,possible V2 new changes. 2. I have told her to stop PM Thyroid dose and we will check bloodwork above. 3. Refer to Cardiology   No orders of the defined types were placed in this encounter.   Doree Albee, MD

## 2019-10-02 LAB — TSH: TSH: 0.4 mIU/L (ref 0.40–4.50)

## 2019-10-02 LAB — T3, FREE: T3, Free: 2.7 pg/mL (ref 2.3–4.2)

## 2019-10-18 ENCOUNTER — Telehealth: Payer: Self-pay | Admitting: *Deleted

## 2019-10-18 ENCOUNTER — Encounter: Payer: Self-pay | Admitting: Podiatry

## 2019-10-18 ENCOUNTER — Other Ambulatory Visit: Payer: Self-pay

## 2019-10-18 ENCOUNTER — Ambulatory Visit (INDEPENDENT_AMBULATORY_CARE_PROVIDER_SITE_OTHER): Payer: Medicare Other

## 2019-10-18 ENCOUNTER — Ambulatory Visit (INDEPENDENT_AMBULATORY_CARE_PROVIDER_SITE_OTHER): Payer: Medicare Other | Admitting: Podiatry

## 2019-10-18 DIAGNOSIS — M79671 Pain in right foot: Secondary | ICD-10-CM

## 2019-10-18 DIAGNOSIS — M7732 Calcaneal spur, left foot: Secondary | ICD-10-CM

## 2019-10-18 DIAGNOSIS — M722 Plantar fascial fibromatosis: Secondary | ICD-10-CM | POA: Diagnosis not present

## 2019-10-18 MED ORDER — MELOXICAM 15 MG PO TABS: 15.0000 mg | ORAL_TABLET | Freq: Every day | ORAL | 3 refills | Status: DC

## 2019-10-18 MED ORDER — MELOXICAM 15 MG PO TABS: 15.0000 mg | ORAL_TABLET | Freq: Every day | ORAL | 0 refills | Status: DC

## 2019-10-18 NOTE — Telephone Encounter (Signed)
Per Dr Jacqualyn Posey patient can get a 90 day supply of the meloxicam 15 mg tablet. Megan Hill

## 2019-10-18 NOTE — Patient Instructions (Addendum)
Meloxicam tablets What is this medicine? MELOXICAM (mel OX i cam) is a non-steroidal anti-inflammatory drug (NSAID). It is used to reduce swelling and to treat pain. It may be used for osteoarthritis, rheumatoid arthritis, or juvenile rheumatoid arthritis. This medicine may be used for other purposes; ask your health care provider or pharmacist if you have questions. COMMON BRAND NAME(S): Mobic What should I tell my health care provider before I take this medicine? They need to know if you have any of these conditions:  bleeding disorders  cigarette smoker  coronary artery bypass graft (CABG) surgery within the past 2 weeks  drink more than 3 alcohol-containing drinks per day  heart disease  high blood pressure  history of stomach bleeding  kidney disease  liver disease  lung or breathing disease, like asthma  stomach or intestine problems  an unusual or allergic reaction to meloxicam, aspirin, other NSAIDs, other medicines, foods, dyes, or preservatives  pregnant or trying to get pregnant  breast-feeding How should I use this medicine? Take this medicine by mouth with a full glass of water. Follow the directions on the prescription label. You can take it with or without food. If it upsets your stomach, take it with food. Take your medicine at regular intervals. Do not take it more often than directed. Do not stop taking except on your doctor's advice. A special MedGuide will be given to you by the pharmacist with each prescription and refill. Be sure to read this information carefully each time. Talk to your pediatrician regarding the use of this medicine in children. While this drug may be prescribed for selected conditions, precautions do apply. Patients over 16 years old may have a stronger reaction and need a smaller dose. Overdosage: If you think you have taken too much of this medicine contact a poison control center or emergency room at once. NOTE: This medicine is  only for you. Do not share this medicine with others. What if I miss a dose? If you miss a dose, take it as soon as you can. If it is almost time for your next dose, take only that dose. Do not take double or extra doses. What may interact with this medicine? Do not take this medicine with any of the following medications:  cidofovir  ketorolac This medicine may also interact with the following medications:  aspirin and aspirin-like medicines  certain medicines for blood pressure, heart disease, irregular heart beat  certain medicines for depression, anxiety, or psychotic disturbances  certain medicines that treat or prevent blood clots like warfarin, enoxaparin, dalteparin, apixaban, dabigatran, rivaroxaban  cyclosporine  diuretics  fluconazole  lithium  methotrexate  other NSAIDs, medicines for pain and inflammation, like ibuprofen and naproxen  pemetrexed This list may not describe all possible interactions. Give your health care provider a list of all the medicines, herbs, non-prescription drugs, or dietary supplements you use. Also tell them if you smoke, drink alcohol, or use illegal drugs. Some items may interact with your medicine. What should I watch for while using this medicine? Tell your doctor or healthcare provider if your symptoms do not start to get better or if they get worse. This medicine may cause serious skin reactions. They can happen weeks to months after starting the medicine. Contact your healthcare provider right away if you notice fevers or flu-like symptoms with a rash. The rash may be red or purple and then turn into blisters or peeling of the skin. Or, you might notice a red rash with  swelling of the face, lips or lymph nodes in your neck or under your arms. Do not take other medicines that contain aspirin, ibuprofen, or naproxen with this medicine. Side effects such as stomach upset, nausea, or ulcers may be more likely to occur. Many medicines  available without a prescription should not be taken with this medicine. This medicine can cause ulcers and bleeding in the stomach and intestines at any time during treatment. This can happen with no warning and may cause death. There is increased risk with taking this medicine for a long time. Smoking, drinking alcohol, older age, and poor health can also increase risks. Call your doctor right away if you have stomach pain or blood in your vomit or stool. This medicine does not prevent heart attack or stroke. In fact, this medicine may increase the chance of a heart attack or stroke. The chance may increase with longer use of this medicine and in people who have heart disease. If you take aspirin to prevent heart attack or stroke, talk with your doctor or healthcare provider. What side effects may I notice from receiving this medicine? Side effects that you should report to your doctor or health care professional as soon as possible:  allergic reactions like skin rash, itching or hives, swelling of the face, lips, or tongue  nausea, vomiting  redness, blistering, peeling, or loosening of the skin, including inside the mouth  signs and symptoms of a blood clot such as breathing problems; changes in vision; chest pain; severe, sudden headache; pain, swelling, warmth in the leg; trouble speaking; sudden numbness or weakness of the face, arm, or leg  signs and symptoms of bleeding such as bloody or black, tarry stools; red or dark-brown urine; spitting up blood or brown material that looks like coffee grounds; red spots on the skin; unusual bruising or bleeding from the eye, gums, or nose  signs and symptoms of liver injury like dark yellow or brown urine; general ill feeling or flu-like symptoms; light-colored stools; loss of appetite; nausea; right upper belly pain; unusually weak or tired; yellowing of the eyes or skin  signs and symptoms of stroke like changes in vision; confusion; trouble  speaking or understanding; severe headaches; sudden numbness or weakness of the face, arm, or leg; trouble walking; dizziness; loss of balance or coordination Side effects that usually do not require medical attention (report to your doctor or health care professional if they continue or are bothersome):  constipation  diarrhea  gas This list may not describe all possible side effects. Call your doctor for medical advice about side effects. You may report side effects to FDA at 1-800-FDA-1088. Where should I keep my medicine? Keep out of the reach of children. Store at room temperature between 15 and 30 degrees C (59 and 86 degrees F). Throw away any unused medicine after the expiration date. NOTE: This sheet is a summary. It may not cover all possible information. If you have questions about this medicine, talk to your doctor, pharmacist, or health care provider.  2020 Elsevier/Gold Standard (2018-12-12 11:21:28)   Plantar Fasciitis (Heel Spur Syndrome) with Rehab The plantar fascia is a fibrous, ligament-like, soft-tissue structure that spans the bottom of the foot. Plantar fasciitis is a condition that causes pain in the foot due to inflammation of the tissue. SYMPTOMS   Pain and tenderness on the underneath side of the foot.  Pain that worsens with standing or walking. CAUSES  Plantar fasciitis is caused by irritation and injury to  the plantar fascia on the underneath side of the foot. Common mechanisms of injury include:  Direct trauma to bottom of the foot.  Damage to a small nerve that runs under the foot where the main fascia attaches to the heel bone.  Stress placed on the plantar fascia due to bone spurs. RISK INCREASES WITH:   Activities that place stress on the plantar fascia (running, jumping, pivoting, or cutting).  Poor strength and flexibility.  Improperly fitted shoes.  Tight calf muscles.  Flat feet.  Failure to warm-up properly before  activity.  Obesity. PREVENTION  Warm up and stretch properly before activity.  Allow for adequate recovery between workouts.  Maintain physical fitness:  Strength, flexibility, and endurance.  Cardiovascular fitness.  Maintain a health body weight.  Avoid stress on the plantar fascia.  Wear properly fitted shoes, including arch supports for individuals who have flat feet.  PROGNOSIS  If treated properly, then the symptoms of plantar fasciitis usually resolve without surgery. However, occasionally surgery is necessary.  RELATED COMPLICATIONS   Recurrent symptoms that may result in a chronic condition.  Problems of the lower back that are caused by compensating for the injury, such as limping.  Pain or weakness of the foot during push-off following surgery.  Chronic inflammation, scarring, and partial or complete fascia tear, occurring more often from repeated injections.  TREATMENT  Treatment initially involves the use of ice and medication to help reduce pain and inflammation. The use of strengthening and stretching exercises may help reduce pain with activity, especially stretches of the Achilles tendon. These exercises may be performed at home or with a therapist. Your caregiver may recommend that you use heel cups of arch supports to help reduce stress on the plantar fascia. Occasionally, corticosteroid injections are given to reduce inflammation. If symptoms persist for greater than 6 months despite non-surgical (conservative), then surgery may be recommended.   MEDICATION   If pain medication is necessary, then nonsteroidal anti-inflammatory medications, such as aspirin and ibuprofen, or other minor pain relievers, such as acetaminophen, are often recommended.  Do not take pain medication within 7 days before surgery.  Prescription pain relievers may be given if deemed necessary by your caregiver. Use only as directed and only as much as you need.  Corticosteroid  injections may be given by your caregiver. These injections should be reserved for the most serious cases, because they may only be given a certain number of times.  HEAT AND COLD  Cold treatment (icing) relieves pain and reduces inflammation. Cold treatment should be applied for 10 to 15 minutes every 2 to 3 hours for inflammation and pain and immediately after any activity that aggravates your symptoms. Use ice packs or massage the area with a piece of ice (ice massage).  Heat treatment may be used prior to performing the stretching and strengthening activities prescribed by your caregiver, physical therapist, or athletic trainer. Use a heat pack or soak the injury in warm water.  SEEK IMMEDIATE MEDICAL CARE IF:  Treatment seems to offer no benefit, or the condition worsens.  Any medications produce adverse side effects.  EXERCISES- RANGE OF MOTION (ROM) AND STRETCHING EXERCISES - Plantar Fasciitis (Heel Spur Syndrome) These exercises may help you when beginning to rehabilitate your injury. Your symptoms may resolve with or without further involvement from your physician, physical therapist or athletic trainer. While completing these exercises, remember:   Restoring tissue flexibility helps normal motion to return to the joints. This allows healthier, less painful movement  and activity.  An effective stretch should be held for at least 30 seconds.  A stretch should never be painful. You should only feel a gentle lengthening or release in the stretched tissue.  RANGE OF MOTION - Toe Extension, Flexion  Sit with your right / left leg crossed over your opposite knee.  Grasp your toes and gently pull them back toward the top of your foot. You should feel a stretch on the bottom of your toes and/or foot.  Hold this stretch for 10 seconds.  Now, gently pull your toes toward the bottom of your foot. You should feel a stretch on the top of your toes and or foot.  Hold this stretch for 10  seconds. Repeat  times. Complete this stretch 3 times per day.   RANGE OF MOTION - Ankle Dorsiflexion, Active Assisted  Remove shoes and sit on a chair that is preferably not on a carpeted surface.  Place right / left foot under knee. Extend your opposite leg for support.  Keeping your heel down, slide your right / left foot back toward the chair until you feel a stretch at your ankle or calf. If you do not feel a stretch, slide your bottom forward to the edge of the chair, while still keeping your heel down.  Hold this stretch for 10 seconds. Repeat 3 times. Complete this stretch 2 times per day.   STRETCH  Gastroc, Standing  Place hands on wall.  Extend right / left leg, keeping the front knee somewhat bent.  Slightly point your toes inward on your back foot.  Keeping your right / left heel on the floor and your knee straight, shift your weight toward the wall, not allowing your back to arch.  You should feel a gentle stretch in the right / left calf. Hold this position for 10 seconds. Repeat 3 times. Complete this stretch 2 times per day.  STRETCH  Soleus, Standing  Place hands on wall.  Extend right / left leg, keeping the other knee somewhat bent.  Slightly point your toes inward on your back foot.  Keep your right / left heel on the floor, bend your back knee, and slightly shift your weight over the back leg so that you feel a gentle stretch deep in your back calf.  Hold this position for 10 seconds. Repeat 3 times. Complete this stretch 2 times per day.  STRETCH  Gastrocsoleus, Standing  Note: This exercise can place a lot of stress on your foot and ankle. Please complete this exercise only if specifically instructed by your caregiver.   Place the ball of your right / left foot on a step, keeping your other foot firmly on the same step.  Hold on to the wall or a rail for balance.  Slowly lift your other foot, allowing your body weight to press your heel down over  the edge of the step.  You should feel a stretch in your right / left calf.  Hold this position for 10 seconds.  Repeat this exercise with a slight bend in your right / left knee. Repeat 3 times. Complete this stretch 2 times per day.   STRENGTHENING EXERCISES - Plantar Fasciitis (Heel Spur Syndrome)  These exercises may help you when beginning to rehabilitate your injury. They may resolve your symptoms with or without further involvement from your physician, physical therapist or athletic trainer. While completing these exercises, remember:   Muscles can gain both the endurance and the strength needed for everyday  activities through controlled exercises.  Complete these exercises as instructed by your physician, physical therapist or athletic trainer. Progress the resistance and repetitions only as guided.  STRENGTH - Towel Curls  Sit in a chair positioned on a non-carpeted surface.  Place your foot on a towel, keeping your heel on the floor.  Pull the towel toward your heel by only curling your toes. Keep your heel on the floor. Repeat 3 times. Complete this exercise 2 times per day.  STRENGTH - Ankle Inversion  Secure one end of a rubber exercise band/tubing to a fixed object (table, pole). Loop the other end around your foot just before your toes.  Place your fists between your knees. This will focus your strengthening at your ankle.  Slowly, pull your big toe up and in, making sure the band/tubing is positioned to resist the entire motion.  Hold this position for 10 seconds.  Have your muscles resist the band/tubing as it slowly pulls your foot back to the starting position. Repeat 3 times. Complete this exercises 2 times per day.  Document Released: 09/12/2005 Document Revised: 12/05/2011 Document Reviewed: 12/25/2008 [facility] Patient Information 2014 [place], [place].

## 2019-10-21 ENCOUNTER — Other Ambulatory Visit: Payer: Self-pay

## 2019-10-21 ENCOUNTER — Telehealth: Payer: Self-pay | Admitting: Cardiology

## 2019-10-21 ENCOUNTER — Encounter: Payer: Self-pay | Admitting: Cardiology

## 2019-10-21 ENCOUNTER — Ambulatory Visit (INDEPENDENT_AMBULATORY_CARE_PROVIDER_SITE_OTHER): Payer: Medicare Other | Admitting: Cardiology

## 2019-10-21 VITALS — BP 162/76 | HR 73 | Ht 64.0 in | Wt 179.0 lb

## 2019-10-21 DIAGNOSIS — R002 Palpitations: Secondary | ICD-10-CM

## 2019-10-21 NOTE — Telephone Encounter (Signed)
14 Day event monitor dx: palpitations  Checking percert

## 2019-10-21 NOTE — Patient Instructions (Addendum)
Medication Instructions:   Your physician recommends that you continue on your current medications as directed. Please refer to the Current Medication list given to you today.  Labwork:  NONE  Testing/Procedures: Your physician has recommended that you wear an event monitor for 14 days. Event monitors are medical devices that record the heart's electrical activity. Doctors most often [place] these monitors to diagnose arrhythmias. Arrhythmias are problems with the speed or rhythm of the heartbeat. The monitor is a small, portable device. You can wear one while you do your normal daily activities. This is usually used to diagnose what is causing palpitations/syncope (passing out). [employer] will contact you about this monitor and it will be mailed to your home address.    Follow-Up:  Your physician recommends that you schedule a follow-up appointment in: 6 weeks (virtual).  Any Other Special Instructions Will Be Listed Below (If Applicable).  If you need a refill on your cardiac medications before your next appointment, please call your pharmacy.

## 2019-10-21 NOTE — Progress Notes (Signed)
Clinical Summary Ms. Freeland is a 69 y.o.female seen as new consult, referred by Dr Anastasio Champion for palpitations  1. Palpitations - Jan 2021 EKG by pcp showed NSR  - daily or weekly feeling of fluttering in chest.  - 2 long episodes. Last one 2.5 weeks ago. While in bed feeling of heart beating strongly. Lasted over 45 minutes. Checked heart rates, was up to 150 on bp cuff and her watch for about 1.5 hours. Self resolved.  - limiting caffeine.  - she reports some changes in thyroid med prior to the 2 long episodes. Has had some issues with regulation.      2. DM2 - diet controlled   3. LE edema - started about 2-3 weeks ago - can have some SOB at times.    4. HTN - compliant with meds - on meloxicam by pcp Past Medical History:  Diagnosis Date  . Colon adenomas    JUL 2007, MAR 2006  . Diabetes mellitus without complication ([place])   . Diverticulitis   . Essential hypertension, benign 08/13/2019  . Headache   . Hypertension   . Hypothyroidism, adult 08/13/2019  . 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 Thyroid disease   . Tubulovillous adenoma of colon 03/2006     Allergies  Allergen Reactions  . Metformin And Related Diarrhea  . Penicillins     Has patient had a PCN reaction causing immediate rash, facial/tongue/throat swelling, SOB or lightheadedness with hypotension: Yes Has patient had a PCN reaction causing severe rash involving mucus membranes or skin necrosis: No Has patient had a PCN reaction that required hospitalization: No Has patient had a PCN reaction occurring within the last 10 years: No If all of the above answers are "NO", then may proceed with Cephalosporin use.     Current Outpatient Medications  Medication Sig Dispense Refill  . Ascorbic Acid (VITAMIN C) 1000 MG tablet Take 2,000 mg by mouth 2 (two) times a day.     Marland Kitchen aspirin EC 81 MG tablet Take 81 mg by mouth daily.    . Cholecalciferol (VITAMIN D3) 5000 units CAPS Take 5,000 Units by mouth  2 (two) times a day.     . Coenzyme Q10 (CO Q-10) 200 MG CAPS Take 200 mg by mouth daily.    . Cyanocobalamin (VITAMIN B-12) 2500 MCG SUBL Take by mouth daily.    Marland Kitchen estradiol (ESTRACE) 1 MG tablet Take 1 tablet (1 mg total) by mouth daily. 30 tablet 3  . Garlic 123XX123 MG CAPS Take 1 capsule by mouth daily.    . hydrochlorothiazide (MICROZIDE) 12.5 MG capsule Take 12.5 mg by mouth daily.    Marland Kitchen loratadine (CLARITIN) 10 MG tablet Take 10 mg by mouth daily.    Marland Kitchen losartan (COZAAR) 50 MG tablet Take 50 mg by mouth daily.    . Magnesium 400 MG TABS Take 1 tablet by mouth daily.    . Melatonin 1 MG TABS Take 1 tablet by mouth at bedtime.    . meloxicam (MOBIC) 15 MG tablet Take 1 tablet (15 mg total) by mouth daily. 90 tablet 3  . progesterone (PROMETRIUM) 200 MG capsule Take 2 capsules (400 mg total) by mouth daily. 60 capsule 3  . SELENIUM PO Take 1 tablet by mouth daily.    . TESTOSTERONE CYPIONATE IM Inject 0.15 mLs into the muscle once a week.     . thyroid (NP THYROID) 60 MG tablet Take 1 tablet (60 mg total) by mouth daily  before breakfast. 30 tablet 3  . thyroid (NP THYROID) 90 MG tablet Take 1 tablet (90 mg total) by mouth daily. Takes in AM 30 tablet 3  . Vitamin A 2400 MCG (8000 UT) TABS Take 1 tablet by mouth daily.     No current facility-administered medications for this visit.     Past Surgical History:  Procedure Laterality Date  . APPENDECTOMY    . CHOLECYSTECTOMY, LAPAROSCOPIC  2005   GALLSTONES TNTC, CHOLECYSTITIS  . COLON SURGERY  2004   LARGE POLYP WITH FOCI OF CARCINOMA  . COLONOSCOPY  10/2015   NL ANASTOMOSIS, HYPERPLASTIC POLYP  . HYSTERECTOMY ABDOMINAL WITH SALPINGO-OOPHORECTOMY  AGE 9   PROLAPSED UTERUS  . TONSILLECTOMY       Allergies  Allergen Reactions  . Metformin And Related Diarrhea  . Penicillins     Has patient had a PCN reaction causing immediate rash, facial/tongue/throat swelling, SOB or lightheadedness with hypotension: Yes Has patient had a PCN  reaction causing severe rash involving mucus membranes or skin necrosis: No Has patient had a PCN reaction that required hospitalization: No Has patient had a PCN reaction occurring within the last 10 years: No If all of the above answers are "NO", then may proceed with Cephalosporin use.      Family History  Problem Relation Age of Onset  . Colon polyps Father        "FULL OF POLYPS"  . Colon cancer Paternal Aunt   . Cervical cancer Daughter      Social History Ms. Piggott reports that she has never smoked. She has never used smokeless tobacco. Ms. Manchego reports no history of alcohol use.   Review of Systems CONSTITUTIONAL: No weight loss, fever, chills, weakness or fatigue.  HEENT: Eyes: No visual loss, blurred vision, double vision or yellow sclerae.No hearing loss, sneezing, congestion, runny nose or sore throat.  SKIN: No rash or itching.  CARDIOVASCULAR: per hpi RESPIRATORY: No shortness of breath, cough or sputum.  GASTROINTESTINAL: No anorexia, nausea, vomiting or diarrhea. No abdominal pain or blood.  GENITOURINARY: No burning on urination, no polyuria NEUROLOGICAL: No headache, dizziness, syncope, paralysis, ataxia, numbness or tingling in the extremities. No change in bowel or bladder control.  MUSCULOSKELETAL: No muscle, back pain, joint pain or stiffness.  LYMPHATICS: No enlarged nodes. No history of splenectomy.  PSYCHIATRIC: No history of depression or anxiety.  ENDOCRINOLOGIC: No reports of sweating, cold or heat intolerance. No polyuria or polydipsia.  Marland Kitchen   Physical Examination Today's Vitals   10/21/19 0859  BP: (!) 162/76  Pulse: 73  SpO2: 96%  Weight: 179 lb (81.2 kg)  Height: 5\' 4"  (1.626 m)   Body mass index is 30.73 kg/m.  Gen: resting comfortably, no acute distress HEENT: no scleral icterus, pupils equal round and reactive, no palptable cervical adenopathy,  CV: RRR, no m/r/g no jvd Resp: Clear to auscultation bilaterally GI: abdomen is  soft, non-tender, non-distended, normal bowel sounds, no hepatosplenomegaly MSK: extremities are warm, no edema.  Skin: warm, no rash Neuro:  no focal deficits Psych: appropriate affect   Diagnostic Studies     Assessment and Plan  1. Palpitations - EKG from pcp showed NSR - will plan 2 week event monitor to further evaluiate - TSH right at the low cut off level  2. LE edema - appears euvolemic today. Monitor at this time, would not plan of echo currently  3. HTN - manual recheck 140/80, reports just started a short course of meloxicam - monitor  at this time, if consistently above 130/80 on f/u would consider medication titration      Arnoldo Lenis, M.D.

## 2019-10-22 ENCOUNTER — Other Ambulatory Visit (INDEPENDENT_AMBULATORY_CARE_PROVIDER_SITE_OTHER): Payer: Self-pay | Admitting: Internal Medicine

## 2019-10-24 ENCOUNTER — Encounter (INDEPENDENT_AMBULATORY_CARE_PROVIDER_SITE_OTHER): Payer: Medicare Other

## 2019-10-24 DIAGNOSIS — R002 Palpitations: Secondary | ICD-10-CM

## 2019-10-25 NOTE — Progress Notes (Signed)
Subjective:   Patient ID: Megan Hill, female   DOB: 69 y.o.   MRN: [id number]   HPI 69 year old female presents the office with concerns of left heel pain which is been ongoing for the last 3 months.  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  She got it for she had bruised her heel.  She describes discomfort in the bottom of her heel but no radiating pain or weakness.  No injury.  She has tried over-the-counter anti-inflammatories.  Review of Systems  All other systems reviewed and are negative.  Past Medical History:  Diagnosis Date  . Colon adenomas    JUL 2007, MAR 2006  . Diabetes mellitus without complication ([place])   . Diverticulitis   . Essential hypertension, benign 08/13/2019  . Headache   . Hypertension   . Hypothyroidism, adult 08/13/2019  . 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 Thyroid disease   . Tubulovillous adenoma of colon 03/2006    Past Surgical History:  Procedure Laterality Date  . APPENDECTOMY    . CHOLECYSTECTOMY, LAPAROSCOPIC  2005   GALLSTONES TNTC, CHOLECYSTITIS  . COLON SURGERY  2004   LARGE POLYP WITH FOCI OF CARCINOMA  . COLONOSCOPY  10/2015   NL ANASTOMOSIS, HYPERPLASTIC POLYP  . HYSTERECTOMY ABDOMINAL WITH SALPINGO-OOPHORECTOMY  AGE 78   PROLAPSED UTERUS  . TONSILLECTOMY       Current Outpatient Medications:  .  Ascorbic Acid (VITAMIN C) 1000 MG tablet, Take 2,000 mg by mouth 2 (two) times a day. , Disp: , Rfl:  .  aspirin EC 81 MG tablet, Take 81 mg by mouth daily., Disp: , Rfl:  .  Cholecalciferol (VITAMIN D3) 5000 units CAPS, Take 5,000 Units by mouth 2 (two) times a day. , Disp: , Rfl:  .  Coenzyme Q10 (CO Q-10) 200 MG CAPS, Take 200 mg by mouth daily., Disp: , Rfl:  .  Cyanocobalamin (VITAMIN B-12) 2500 MCG SUBL, Take by mouth daily., Disp: , Rfl:  .  estradiol (ESTRACE) 1 MG tablet, Take 1 tablet (1 mg total) by mouth daily., Disp: 30 tablet, Rfl: 3 .  Garlic 123XX123  MG CAPS, Take 1 capsule by mouth daily., Disp: , Rfl:  .  hydrochlorothiazide (HYDRODIURIL) 12.5 MG tablet, TAKE 1 TABLET BY MOUTH DAILY, Disp: 90 tablet, Rfl: 0 .  hydrochlorothiazide (MICROZIDE) 12.5 MG capsule, Take 12.5 mg by mouth daily., Disp: , Rfl:  .  loratadine (CLARITIN) 10 MG tablet, Take 10 mg by mouth daily., Disp: , Rfl:  .  losartan (COZAAR) 50 MG tablet, TAKE 1 TABLET BY MOUTH DAILY, Disp: 90 tablet, Rfl: 0 .  Magnesium 400 MG TABS, Take 1 tablet by mouth daily., Disp: , Rfl:  .  Melatonin 1 MG TABS, Take 1 tablet by mouth at bedtime., Disp: , Rfl:  .  meloxicam (MOBIC) 15 MG tablet, Take 1 tablet (15 mg total) by mouth daily., Disp: 90 tablet, Rfl: 3 .  progesterone (PROMETRIUM) 200 MG capsule, Take 2 capsules (400 mg total) by mouth daily., Disp: 60 capsule, Rfl: 3 .  SELENIUM PO, Take 1 tablet by mouth daily., Disp: , Rfl:  .  TESTOSTERONE CYPIONATE IM, Inject 0.15 mLs into the muscle once a week. , Disp: , Rfl:  .  thyroid (NP THYROID) 90 MG tablet, Take 1 tablet (90 mg total) by mouth daily. Takes in AM, Disp: 30 tablet, Rfl: 3 .  Vitamin  A 2400 MCG (8000 UT) TABS, Take 1 tablet by mouth daily., Disp: , Rfl:   Allergies  Allergen Reactions  . Metformin And Related Diarrhea  . Penicillins     Has patient had a PCN reaction causing immediate rash, facial/tongue/throat swelling, SOB or lightheadedness with hypotension: Yes Has patient had a PCN reaction causing severe rash involving mucus membranes or skin necrosis: No Has patient had a PCN reaction that required hospitalization: No Has patient had a PCN reaction occurring within the last 10 years: No If all of the above answers are "NO", then may proceed with Cephalosporin use.          Objective:  Physical Exam  General: AAO x3, NAD  Dermatological: Skin is warm, dry and supple bilateral. Nails x 10 are well manicured; remaining integument appears unremarkable at this time. There are no open sores, no  preulcerative lesions, no rash or signs of infection present.  Vascular: Dorsalis Pedis artery and Posterior Tibial artery pedal pulses are 2/4 bilateral with immedate capillary fill time. There is no pain with calf compression, swelling, warmth, erythema.   Neruologic: Grossly intact via light touch bilateral. Protective threshold with Semmes Wienstein monofilament intact to all pedal sites bilateral.   Musculoskeletal: There is tenderness palpation on plantar medial tubercle of the calcaneus at the insertion of plantar fascia on the left side.  Plantar fascial appears to be intact.  No pain with lateral compression of the calcaneus.  No pain with Achilles tendon.  No other areas of discomfort.  Muscular strength 5/5 in all groups tested bilateral.  Gait: Unassisted, Nonantalgic.       Assessment:   Left heel pain likely due to result of plantar fasciitis, bone spur     Plan:  -Treatment options discussed including all alternatives, risks, and complications -Etiology of symptoms were discussed -X-rays were obtained and reviewed with the patient.  Inferior calcaneal spurring is present. -She was to hold off on steroid injection. -Prescribed mobic. Discussed side effects of the medication and directed to stop if any are to occur and call the office.  -Plantar fascial brace dispensed -Discussed shoe modifications and orthotics -Stretching, icing daily  Return for heel pain in 4-6 weeks.  Trula Slade DPM

## 2019-11-01 DIAGNOSIS — Z23 Encounter for immunization: Secondary | ICD-10-CM | POA: Diagnosis not present

## 2019-11-12 ENCOUNTER — Other Ambulatory Visit: Payer: Self-pay

## 2019-11-12 ENCOUNTER — Ambulatory Visit (INDEPENDENT_AMBULATORY_CARE_PROVIDER_SITE_OTHER): Payer: Medicare Other | Admitting: Podiatry

## 2019-11-12 DIAGNOSIS — M7732 Calcaneal spur, left foot: Secondary | ICD-10-CM | POA: Diagnosis not present

## 2019-11-12 DIAGNOSIS — M722 Plantar fascial fibromatosis: Secondary | ICD-10-CM

## 2019-11-12 NOTE — Patient Instructions (Signed)

## 2019-11-14 ENCOUNTER — Ambulatory Visit (INDEPENDENT_AMBULATORY_CARE_PROVIDER_SITE_OTHER): Payer: Medicare Other | Admitting: Internal Medicine

## 2019-11-15 ENCOUNTER — Ambulatory Visit: Payer: Medicare Other | Admitting: Podiatry

## 2019-11-18 DIAGNOSIS — M722 Plantar fascial fibromatosis: Secondary | ICD-10-CM | POA: Insufficient documentation

## 2019-11-18 NOTE — Progress Notes (Signed)
Subjective: 69 year old female presents the office today for follow-up evaluation of left heel pain, plantar fasciitis/bone spur.  She states the brace is making a big difference in overall she is feeling much better.  No recent injury or changes since I last saw her. Denies any systemic complaints such as fevers, chills, nausea, vomiting. No acute changes since last appointment, and no other complaints at this time.   Objective: AAO x3, NAD DP/PT pulses palpable bilaterally, CRT less than 3 seconds There is currently no discomfort along the course or insertion of plantar fascia.  Plantar fascial peers to be intact.  No pain with lateral compression of calcaneus.  No pain with Achilles tendon.  No other areas of discomfort.  No open lesions or pre-ulcerative lesions.  No pain with calf compression, swelling, warmth, erythema  Assessment: Resolving heel pain, plantar fasciitis/bone spur  Plan: -All treatment options discussed with the patient including all alternatives, risks, complications.  -On her continue with stretching, rehab exercises daily.  Wean off the ankle brace but discussed wearing more supportive shoes with inserts.  She wants to hold off on the steroid injection.  Continue to ice the area daily as well. -Patient encouraged to call the office with any questions, concerns, change in symptoms.   Return if symptoms worsen or fail to improve.  Trula Slade DPM

## 2019-11-25 ENCOUNTER — Ambulatory Visit (INDEPENDENT_AMBULATORY_CARE_PROVIDER_SITE_OTHER): Payer: Medicare Other | Admitting: Internal Medicine

## 2019-11-25 ENCOUNTER — Other Ambulatory Visit: Payer: Self-pay

## 2019-11-25 ENCOUNTER — Encounter (INDEPENDENT_AMBULATORY_CARE_PROVIDER_SITE_OTHER): Payer: Self-pay | Admitting: Internal Medicine

## 2019-11-25 VITALS — BP 127/80 | HR 78 | Temp 98.3°F | Ht 63.0 in | Wt 175.6 lb

## 2019-11-25 DIAGNOSIS — E119 Type 2 diabetes mellitus without complications: Secondary | ICD-10-CM

## 2019-11-25 DIAGNOSIS — R5383 Other fatigue: Secondary | ICD-10-CM | POA: Diagnosis not present

## 2019-11-25 DIAGNOSIS — E039 Hypothyroidism, unspecified: Secondary | ICD-10-CM

## 2019-11-25 DIAGNOSIS — R5381 Other malaise: Secondary | ICD-10-CM | POA: Diagnosis not present

## 2019-11-25 DIAGNOSIS — I10 Essential (primary) hypertension: Secondary | ICD-10-CM

## 2019-11-25 NOTE — Progress Notes (Signed)
Metrics: Intervention Frequency ACO  Documented Smoking Status Yearly  Screened one or more times in 24 months  Cessation Counseling or  Active cessation medication Past 24 months  Past 24 months   Guideline developer: UpToDate (See UpToDate for funding source) Date Released: 2014       Wellness Office Visit  Subjective:  Patient ID: Megan Hill, female    DOB: [birth year]  Age: 69 y.o. MRN: [id number]  CC: This lady comes in for follow-up of hypertension, hypothyroidism, menopausal symptoms, diabetes.  HPI She decided to stop bioidentical hormone therapy at that do not think she was tolerating it too well.  She did notice that when she was taking testosterone, it did give her more energy and increased libido.  She still has some testosterone injectable at home. In terms of her diabetes, she is not taking any medications and we need to monitor this. She is frustrated that she is unable to lose weight.  She is also frustrated that she has very little energy.  She is following a ketogenic diet and she is consistently eating only 20 g of carbohydrates every day.  I suspect this is probably to blame for her lack of energy at the present time.  Past Medical History:  Diagnosis Date  . Colon adenomas    JUL 2007, MAR 2006  . Diabetes mellitus without complication ([place])   . Diverticulitis   . Essential hypertension, benign 08/13/2019  . Headache   . Hypertension   . Hypothyroidism, adult 08/13/2019  . 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 Thyroid disease   . Tubulovillous adenoma of colon 03/2006      Family History  Problem Relation Age of Onset  . Colon polyps Father        "FULL OF POLYPS"  . Colon cancer Paternal Aunt   . Cervical cancer Daughter     Social History   Social History Narrative   [phone]). KIDS-2(BORN 1974, 76). OLDEST DAUGHTER WORKS IN [place]. 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   Social History   Tobacco Use  . Smoking status: Never Smoker  . Smokeless tobacco: Never Used  Substance Use Topics  . Alcohol use: No    Current Meds  Medication Sig  . Ascorbic Acid (VITAMIN C) 1000 MG tablet Take 2,000 mg by mouth 2 (two) times a day.   Marland Kitchen aspirin EC 81 MG tablet Take 81 mg by mouth daily.  . Cholecalciferol (VITAMIN D3) 5000 units CAPS Take 5,000 Units by mouth 2 (two) times a day.   . Coenzyme Q10 (CO Q-10) 200 MG CAPS Take 200 mg by mouth daily.  . Cyanocobalamin (VITAMIN B-12) 2500 MCG SUBL Take by mouth daily.  . Garlic [id number] MG CAPS Take 1 capsule by mouth daily.  . hydrochlorothiazide (HYDRODIURIL) 12.5 MG tablet TAKE 1 TABLET BY MOUTH DAILY  . loratadine (CLARITIN) 10 MG tablet Take 10 mg by mouth daily.  Marland Kitchen losartan (COZAAR) 50 MG tablet TAKE 1 TABLET BY MOUTH DAILY  . Magnesium 400 MG TABS Take 1 tablet by mouth daily.  . Melatonin 1 MG TABS Take 1 tablet by mouth at bedtime.  . NP THYROID 90 MG tablet Take 90 mg by mouth daily.  . SELENIUM PO Take 1 tablet by mouth daily.  . Vitamin A 2400 MCG (8000 UT) TABS Take 1 tablet by mouth daily.  Objective:   Today's Vitals: BP 127/80 (BP Location: Right Arm, Patient Position: Sitting, Cuff Size: Normal)   Pulse 78   Temp 98.3 F (36.8 C) (Temporal)   Ht 5\' 3"  (1.6 m)   Wt 175 lb 9.6 oz (79.7 kg)   SpO2 94%   BMI 31.11 kg/m  Vitals with BMI 11/25/2019 10/21/2019 10/01/2019  Height 5\' 3"  5\' 4"  5\' 4"   Weight 175 lbs 10 oz 179 lbs 174 lbs  BMI 31.11 [id number]  Systolic [id number]  Diastolic 80 76 70  Pulse 78 73 85     Physical Exam   She looks systemically well.  Blood pressure is reasonable.  She has lost 4 pounds since January but largely unchanged.  She is alert and orientated without any focal neurological signs.    Assessment   1. Hypothyroidism, adult   2. Malaise and fatigue   3. Essential  hypertension, benign   4. Diabetes mellitus without complication ([place])       Tests ordered Orders Placed This Encounter  Procedures  . COMPLETE METABOLIC PANEL WITH GFR  . Hemoglobin A1c  . T3, free  . TSH  . T4     Plan: 1. Blood work is ordered as above. 2. She will continue with current dose of desiccated thyroid and we may need to add synthetic T3 to see if it will benefit her.  In the past, when I have increased NP thyroid dose, she start to get palpitations.  She did see cardiologist and had an event monitor which was unremarkable apparently. 3. She will continue with same doses of antihypertensive medications and this seems to be controlling her blood pressure. 4. In terms of her diabetes, we will check an A1c today and see where she is.  I have recommended intermittent fasting combined with a plant-based diet. 5. Further recommendations will depend on blood results and I will see her in 6 weeks time for close follow-up.   No orders of the defined types were placed in this encounter.   Doree Albee, MD

## 2019-11-25 NOTE — Patient Instructions (Signed)
[facility] Dietary Recommendations for Weight Loss What to Avoid . Avoid added sugars o Often added sugar can be found in processed foods such as many condiments, dry cereals, cakes, cookies, chips, crisps, crackers, candies, sweetened drinks, etc.  o Read labels and AVOID/DECREASE use of foods with the following in their ingredient list: Sugar, fructose, high fructose corn syrup, sucrose, glucose, maltose, dextrose, molasses, cane sugar, brown sugar, any type of syrup, agave nectar, etc.   . Avoid snacking in between meals . Avoid foods made with flour o If you are going to eat food made with flour, choose those made with whole-grains; and, minimize your consumption as much as is tolerable . Avoid processed foods o These foods are generally stocked in the middle of the grocery store. Focus on shopping on the perimeter of the grocery.  . Avoid Meat  o We recommend following a plant-based diet at [facility]. Thus, we recommend avoiding meat as a general rule. Consider eating beans, legumes, eggs, and/or dairy products for regular protein sources o If you plan on eating meat limit to 4 ounces of meat at a time and choose lean options such as Fish, chicken, turkey. Avoid red meat intake such as pork and/or steak What to Include . Vegetables o GREEN LEAFY VEGETABLES: Kale, spinach, mustard greens, collard greens, cabbage, broccoli, etc. o OTHER: Asparagus, cauliflower, eggplant, carrots, peas, Brussel sprouts, tomatoes, bell peppers, zucchini, beets, cucumbers, etc. . Grains, seeds, and legumes o Beans: kidney beans, black eyed peas, garbanzo beans, black beans, pinto beans, etc. o Whole, unrefined grains: brown rice, barley, bulgur, oatmeal, etc. . Healthy fats  o Avoid highly processed fats such as vegetable oil o Examples of healthy fats: avocado, olives, virgin olive oil, dark chocolate (?72% Cocoa), nuts (peanuts, almonds, walnuts, cashews, pecans, etc.) . None to Low  Intake of Animal Sources of Protein o Meat sources: chicken, turkey, salmon, tuna. Limit to 4 ounces of meat at one time. o Consider limiting dairy sources, but when choosing dairy focus on: PLAIN Greek yogurt, cottage cheese, high-protein milk . Fruit o Choose berries  When to Eat . Intermittent Fasting: o Choosing not to eat for a specific time period, but DO FOCUS ON HYDRATION when fasting o Multiple Techniques: - Time Restricted Eating: eat 3 meals in a day, each meal lasting no more than 60 minutes, no snacks between meals - 16-18 hour fast: fast for 16 to 18 hours up to 7 days a week. Often suggested to start with 2-3 nonconsecutive days per week.  . Remember the time you sleep is counted as fasting.  . Examples of eating schedule: Fast from 7:00pm-11:00am. Eat between 11:00am-7:00pm.  - 24-hour fast: fast for 24 hours up to every other day. Often suggested to start with 1 day per week . Remember the time you sleep is counted as fasting . Examples of eating schedule:  o Eating day: eat 2-3 meals on your eating day. If doing 2 meals, each meal should last no more than 90 minutes. If doing 3 meals, each meal should last no more than 60 minutes. Finish last meal by 7:00pm. o Fasting day: Fast until 7:00pm.  o IF YOU FEEL UNWELL FOR ANY REASON/IN ANY WAY WHEN FASTING, STOP FASTING BY EATING A NUTRITIOUS SNACK OR LIGHT MEAL o ALWAYS FOCUS ON HYDRATION DURING FASTS - Acceptable Hydration sources: water, broths, tea/coffee (black tea/coffee is best but using a small amount of whole-fat dairy products in coffee/tea is acceptable).  -   Poor Hydration Sources: anything with sugar or artificial sweeteners added to it  These recommendations have been developed for patients that are actively receiving medical care from either Dr. Keerthana Vanrossum or Sarah Gray, DNP, NP-C at [facility]. These recommendations are developed for patients with specific medical conditions and are not meant to be  distributed or used by others that are not actively receiving care from either provider listed above at [facility]. It is not appropriate to participate in the above eating plans without proper medical supervision.   Reference: Fung, J. The obesity code. [place]: [place]; 2016.   

## 2019-11-26 ENCOUNTER — Encounter (INDEPENDENT_AMBULATORY_CARE_PROVIDER_SITE_OTHER): Payer: Self-pay | Admitting: Internal Medicine

## 2019-11-26 ENCOUNTER — Other Ambulatory Visit (INDEPENDENT_AMBULATORY_CARE_PROVIDER_SITE_OTHER): Payer: Self-pay | Admitting: Internal Medicine

## 2019-11-26 LAB — TSH: TSH: 3.08 mIU/L (ref 0.40–4.50)

## 2019-11-26 LAB — COMPLETE METABOLIC PANEL WITH GFR
AG Ratio: 1.6 (calc) (ref 1.0–2.5)
ALT: 65 U/L — ABNORMAL HIGH (ref 6–29)
AST: 27 U/L (ref 10–35)
Albumin: 4.6 g/dL (ref 3.6–5.1)
Alkaline phosphatase (APISO): 31 U/L — ABNORMAL LOW (ref 37–153)
BUN: 21 mg/dL (ref 7–25)
CO2: 25 mmol/L (ref 20–32)
Calcium: 10.1 mg/dL (ref 8.6–10.4)
Chloride: 103 mmol/L (ref 98–110)
Creat: 0.78 mg/dL (ref 0.50–0.99)
GFR, Est African American: 92 mL/min/{1.73_m2} (ref >=60)
GFR, Est Non African American: 79 mL/min/{1.73_m2} (ref >=60)
Globulin: 2.8 g/dL (calc) (ref 1.9–3.7)
Glucose, Bld: 188 mg/dL — ABNORMAL HIGH (ref 65–99)
Potassium: 4.4 mmol/L (ref 3.5–5.3)
Sodium: 139 mmol/L (ref 135–146)
Total Bilirubin: 0.7 mg/dL (ref 0.2–1.2)
Total Protein: 7.4 g/dL (ref 6.1–8.1)

## 2019-11-26 LAB — HEMOGLOBIN A1C
Hgb A1c MFr Bld: 6.2 % of total Hgb — ABNORMAL HIGH (ref <5.7)
Mean Plasma Glucose: 131 (calc)
eAG (mmol/L): 7.3 (calc)

## 2019-11-26 LAB — T3, FREE: T3, Free: 3.6 pg/mL (ref 2.3–4.2)

## 2019-11-26 LAB — T4: T4, Total: 4.9 ug/dL — ABNORMAL LOW (ref 5.1–11.9)

## 2019-11-26 MED ORDER — LIOTHYRONINE SODIUM 5 MCG PO TABS: 5.0000 ug | ORAL_TABLET | Freq: Every day | ORAL | 3 refills | Status: DC

## 2019-11-26 NOTE — Telephone Encounter (Signed)
Medication question °

## 2019-11-26 NOTE — Telephone Encounter (Signed)
question

## 2019-11-29 ENCOUNTER — Telehealth: Payer: Self-pay | Admitting: *Deleted

## 2019-11-29 DIAGNOSIS — Z23 Encounter for immunization: Secondary | ICD-10-CM | POA: Diagnosis not present

## 2019-11-29 NOTE — Telephone Encounter (Signed)
-----   Message from Arnoldo Lenis, MD sent at 11/22/2019  1:15 PM EST ----- Heart monitor looks good, normal heart rhythm with just some occasional extra heart beats which is common and considered benign but can cause some symptoms. If ongoing symptoms would start lopressor 25mg  bid. F/u 4 months  Zandra Abts MD

## 2019-11-29 NOTE — Telephone Encounter (Signed)
Pt voiced understanding - says symptoms are not bothersome and recently had thyroid medication adjustments - 4 month f/u scheduled

## 2019-12-04 ENCOUNTER — Telehealth: Payer: Medicare Other | Admitting: Cardiology

## 2019-12-06 DIAGNOSIS — E119 Type 2 diabetes mellitus without complications: Secondary | ICD-10-CM | POA: Diagnosis not present

## 2019-12-06 DIAGNOSIS — H2513 Age-related nuclear cataract, bilateral: Secondary | ICD-10-CM | POA: Diagnosis not present

## 2019-12-06 DIAGNOSIS — H5213 Myopia, bilateral: Secondary | ICD-10-CM | POA: Diagnosis not present

## 2019-12-06 LAB — HM DIABETES EYE EXAM

## 2019-12-13 ENCOUNTER — Encounter (INDEPENDENT_AMBULATORY_CARE_PROVIDER_SITE_OTHER): Payer: Self-pay | Admitting: Internal Medicine

## 2019-12-16 ENCOUNTER — Other Ambulatory Visit (INDEPENDENT_AMBULATORY_CARE_PROVIDER_SITE_OTHER): Payer: Self-pay | Admitting: Internal Medicine

## 2019-12-16 DIAGNOSIS — F419 Anxiety disorder, unspecified: Secondary | ICD-10-CM

## 2019-12-16 NOTE — Telephone Encounter (Signed)
Could you please give a referral to [facility] [phone]. My anxieties have gotten worse. I need help. I did speak with Neoma Laming at the center and she request your referral.

## 2019-12-22 ENCOUNTER — Encounter (INDEPENDENT_AMBULATORY_CARE_PROVIDER_SITE_OTHER): Payer: Self-pay | Admitting: Internal Medicine

## 2019-12-23 NOTE — Telephone Encounter (Signed)
Medication reaction problem.

## 2019-12-31 ENCOUNTER — Telehealth (INDEPENDENT_AMBULATORY_CARE_PROVIDER_SITE_OTHER): Payer: Self-pay

## 2019-12-31 ENCOUNTER — Encounter (INDEPENDENT_AMBULATORY_CARE_PROVIDER_SITE_OTHER): Payer: Self-pay | Admitting: Internal Medicine

## 2019-12-31 ENCOUNTER — Other Ambulatory Visit (INDEPENDENT_AMBULATORY_CARE_PROVIDER_SITE_OTHER): Payer: Self-pay | Admitting: Internal Medicine

## 2019-12-31 MED ORDER — LORAZEPAM 0.5 MG PO TABS: 0.5000 mg | ORAL_TABLET | Freq: Two times a day (BID) | ORAL | 1 refills | Status: DC | PRN

## 2019-12-31 NOTE — Telephone Encounter (Signed)
Let the patient know that I have sent a prescription for Ativan 0.5 mg twice a day as needed for anxiety to [facility] on [address].

## 2019-12-31 NOTE — Telephone Encounter (Signed)
Pt said to tell you thank you she is going to hug your neck when she see's you.

## 2020-01-01 ENCOUNTER — Encounter (INDEPENDENT_AMBULATORY_CARE_PROVIDER_SITE_OTHER): Payer: Self-pay | Admitting: Internal Medicine

## 2020-01-02 ENCOUNTER — Ambulatory Visit (INDEPENDENT_AMBULATORY_CARE_PROVIDER_SITE_OTHER): Payer: Medicare Other | Admitting: Podiatry

## 2020-01-02 ENCOUNTER — Other Ambulatory Visit: Payer: Self-pay

## 2020-01-02 ENCOUNTER — Encounter: Payer: Self-pay | Admitting: Podiatry

## 2020-01-02 DIAGNOSIS — M722 Plantar fascial fibromatosis: Secondary | ICD-10-CM | POA: Diagnosis not present

## 2020-01-02 NOTE — Patient Instructions (Signed)

## 2020-01-06 ENCOUNTER — Ambulatory Visit (INDEPENDENT_AMBULATORY_CARE_PROVIDER_SITE_OTHER): Payer: Medicare Other | Admitting: Internal Medicine

## 2020-01-06 ENCOUNTER — Telehealth: Payer: Self-pay | Admitting: Cardiology

## 2020-01-06 MED ORDER — METOPROLOL TARTRATE 25 MG PO TABS: 25.0000 mg | ORAL_TABLET | Freq: Two times a day (BID) | ORAL | 1 refills | Status: DC

## 2020-01-06 NOTE — Telephone Encounter (Signed)
Has medication questions.  Does not think it is her thyroid and is now ready to start new medication

## 2020-01-06 NOTE — Telephone Encounter (Signed)
Patient says her thyroid medication has been adjusted but is still having palpitations. Says they are not worse but she notices them more and is asking to start the medication that was offered after wearing heart monitor.  ----- Message from Arnoldo Lenis, MD sent at 11/22/2019  1:15 PM EST ----- Heart monitor looks good, normal heart rhythm with just some occasional extra heart beats which is common and considered benign but can cause some symptoms. If ongoing symptoms would start lopressor 25mg  bid. F/u 4 months  Zandra Abts MD

## 2020-01-06 NOTE — Telephone Encounter (Signed)
Yes please start lopressor 25mg  bid   Zandra Abts MD

## 2020-01-07 ENCOUNTER — Other Ambulatory Visit (INDEPENDENT_AMBULATORY_CARE_PROVIDER_SITE_OTHER): Payer: Self-pay | Admitting: Internal Medicine

## 2020-01-07 MED ORDER — ESCITALOPRAM OXALATE 5 MG PO TABS: 5.0000 mg | ORAL_TABLET | Freq: Every day | ORAL | 3 refills | Status: DC

## 2020-01-09 ENCOUNTER — Other Ambulatory Visit (INDEPENDENT_AMBULATORY_CARE_PROVIDER_SITE_OTHER): Payer: Self-pay | Admitting: Internal Medicine

## 2020-01-12 NOTE — Progress Notes (Signed)
Subjective: 69 year old female presents the office today for concerns of left heel pain requesting possible steroid injection.  She said the area is uncomfortable and tender to the bottom of the heel.  She had to hold off on meloxicam but it is increasing her blood pressure.  He was helping however.  No recent injury or falls. Denies any systemic complaints such as fevers, chills, nausea, vomiting. No acute changes since last appointment, and no other complaints at this time.   Objective: AAO x3, NAD DP/PT pulses palpable bilaterally, CRT less than 3 seconds There is tenderness palpation on the plantar medial tubercle of the calcaneus and insertion of plantar fashion the left side.  Plantar pressure appears to be intact.  There is no pain with lateral compression of calcaneus.  No pain to the Achilles tendon. There is no other areas of discomfort identified at this time.  No open lesions or pre-ulcerative lesions.  No pain with calf compression, swelling, warmth, erythema  Assessment: 69 year old female left heel pain, plantar fasciitis  Plan: -All treatment options discussed with the patient including all alternatives, risks, complications.  -Steroid injection performed.  See procedure note below.  Continue stretching, icing as well as wearing supportive shoes daily.  Limit activity for the next few days. -Patient encouraged to call the office with any questions, concerns, change in symptoms.   Procedure: Injection Tendon/Ligament Discussed alternatives, risks, complications and verbal consent was obtained.  Location: LEFT plantar fascia at the glabrous junction; medial approach. Skin Prep: Alcohol  Injectate: 0.5cc 0.5% marcaine plain, 0.5 cc 2% lidocaine plain and, 1 cc kenalog 10. Disposition: Patient tolerated procedure well. Injection site dressed with a band-aid.  Post-injection care was discussed and return precautions discussed.   Return in about 4 weeks (around  01/30/2020).  Trula Slade DPM

## 2020-01-16 ENCOUNTER — Other Ambulatory Visit (INDEPENDENT_AMBULATORY_CARE_PROVIDER_SITE_OTHER): Payer: Self-pay | Admitting: Internal Medicine

## 2020-01-16 DIAGNOSIS — E039 Hypothyroidism, unspecified: Secondary | ICD-10-CM | POA: Diagnosis not present

## 2020-01-16 DIAGNOSIS — G43909 Migraine, unspecified, not intractable, without status migrainosus: Secondary | ICD-10-CM | POA: Diagnosis not present

## 2020-01-16 DIAGNOSIS — R5383 Other fatigue: Secondary | ICD-10-CM | POA: Diagnosis not present

## 2020-01-16 DIAGNOSIS — K5900 Constipation, unspecified: Secondary | ICD-10-CM | POA: Diagnosis not present

## 2020-01-16 DIAGNOSIS — R748 Abnormal levels of other serum enzymes: Secondary | ICD-10-CM | POA: Diagnosis not present

## 2020-01-16 DIAGNOSIS — R739 Hyperglycemia, unspecified: Secondary | ICD-10-CM | POA: Diagnosis not present

## 2020-01-16 DIAGNOSIS — M722 Plantar fascial fibromatosis: Secondary | ICD-10-CM | POA: Diagnosis not present

## 2020-01-16 DIAGNOSIS — F329 Major depressive disorder, single episode, unspecified: Secondary | ICD-10-CM | POA: Diagnosis not present

## 2020-01-16 DIAGNOSIS — E559 Vitamin D deficiency, unspecified: Secondary | ICD-10-CM | POA: Diagnosis not present

## 2020-01-16 DIAGNOSIS — Z7689 Persons encountering health services in other specified circumstances: Secondary | ICD-10-CM | POA: Diagnosis not present

## 2020-01-16 DIAGNOSIS — I10 Essential (primary) hypertension: Secondary | ICD-10-CM | POA: Diagnosis not present

## 2020-01-16 DIAGNOSIS — F419 Anxiety disorder, unspecified: Secondary | ICD-10-CM | POA: Diagnosis not present

## 2020-01-17 DIAGNOSIS — M545 Low back pain: Secondary | ICD-10-CM | POA: Diagnosis not present

## 2020-01-17 DIAGNOSIS — M9903 Segmental and somatic dysfunction of lumbar region: Secondary | ICD-10-CM | POA: Diagnosis not present

## 2020-01-17 DIAGNOSIS — M9905 Segmental and somatic dysfunction of pelvic region: Secondary | ICD-10-CM | POA: Diagnosis not present

## 2020-01-22 DIAGNOSIS — M9905 Segmental and somatic dysfunction of pelvic region: Secondary | ICD-10-CM | POA: Diagnosis not present

## 2020-01-22 DIAGNOSIS — M9903 Segmental and somatic dysfunction of lumbar region: Secondary | ICD-10-CM | POA: Diagnosis not present

## 2020-01-22 DIAGNOSIS — M545 Low back pain: Secondary | ICD-10-CM | POA: Diagnosis not present

## 2020-01-27 DIAGNOSIS — M722 Plantar fascial fibromatosis: Secondary | ICD-10-CM | POA: Diagnosis not present

## 2020-01-27 DIAGNOSIS — M545 Low back pain: Secondary | ICD-10-CM | POA: Diagnosis not present

## 2020-01-27 DIAGNOSIS — M9903 Segmental and somatic dysfunction of lumbar region: Secondary | ICD-10-CM | POA: Diagnosis not present

## 2020-01-27 DIAGNOSIS — M9905 Segmental and somatic dysfunction of pelvic region: Secondary | ICD-10-CM | POA: Diagnosis not present

## 2020-01-30 ENCOUNTER — Ambulatory Visit: Payer: Medicare Other | Admitting: Podiatry

## 2020-02-03 DIAGNOSIS — M9903 Segmental and somatic dysfunction of lumbar region: Secondary | ICD-10-CM | POA: Diagnosis not present

## 2020-02-03 DIAGNOSIS — M545 Low back pain: Secondary | ICD-10-CM | POA: Diagnosis not present

## 2020-02-03 DIAGNOSIS — M722 Plantar fascial fibromatosis: Secondary | ICD-10-CM | POA: Diagnosis not present

## 2020-02-03 DIAGNOSIS — M9905 Segmental and somatic dysfunction of pelvic region: Secondary | ICD-10-CM | POA: Diagnosis not present

## 2020-02-10 DIAGNOSIS — M9903 Segmental and somatic dysfunction of lumbar region: Secondary | ICD-10-CM | POA: Diagnosis not present

## 2020-02-10 DIAGNOSIS — M545 Low back pain: Secondary | ICD-10-CM | POA: Diagnosis not present

## 2020-02-10 DIAGNOSIS — M9905 Segmental and somatic dysfunction of pelvic region: Secondary | ICD-10-CM | POA: Diagnosis not present

## 2020-02-10 DIAGNOSIS — M722 Plantar fascial fibromatosis: Secondary | ICD-10-CM | POA: Diagnosis not present

## 2020-02-13 ENCOUNTER — Ambulatory Visit (INDEPENDENT_AMBULATORY_CARE_PROVIDER_SITE_OTHER): Payer: Medicare Other | Admitting: Internal Medicine

## 2020-02-17 DIAGNOSIS — M9905 Segmental and somatic dysfunction of pelvic region: Secondary | ICD-10-CM | POA: Diagnosis not present

## 2020-02-17 DIAGNOSIS — M9903 Segmental and somatic dysfunction of lumbar region: Secondary | ICD-10-CM | POA: Diagnosis not present

## 2020-02-17 DIAGNOSIS — M545 Low back pain: Secondary | ICD-10-CM | POA: Diagnosis not present

## 2020-02-17 DIAGNOSIS — M722 Plantar fascial fibromatosis: Secondary | ICD-10-CM | POA: Diagnosis not present

## 2020-03-09 DIAGNOSIS — M545 Low back pain: Secondary | ICD-10-CM | POA: Diagnosis not present

## 2020-03-09 DIAGNOSIS — M9905 Segmental and somatic dysfunction of pelvic region: Secondary | ICD-10-CM | POA: Diagnosis not present

## 2020-03-09 DIAGNOSIS — M9903 Segmental and somatic dysfunction of lumbar region: Secondary | ICD-10-CM | POA: Diagnosis not present

## 2020-03-09 DIAGNOSIS — M722 Plantar fascial fibromatosis: Secondary | ICD-10-CM | POA: Diagnosis not present

## 2020-03-21 ENCOUNTER — Other Ambulatory Visit (INDEPENDENT_AMBULATORY_CARE_PROVIDER_SITE_OTHER): Payer: Self-pay | Admitting: Internal Medicine

## 2020-04-10 DIAGNOSIS — G43109 Migraine with aura, not intractable, without status migrainosus: Secondary | ICD-10-CM | POA: Diagnosis not present

## 2020-04-10 DIAGNOSIS — E119 Type 2 diabetes mellitus without complications: Secondary | ICD-10-CM | POA: Diagnosis not present

## 2020-04-15 ENCOUNTER — Other Ambulatory Visit (INDEPENDENT_AMBULATORY_CARE_PROVIDER_SITE_OTHER): Payer: Self-pay | Admitting: Internal Medicine

## 2020-04-18 IMAGING — MG DIGITAL SCREENING BILATERAL MAMMOGRAM WITH TOMO AND CAD
8 series · 9 of 24 positions shown · non-contrast
Comparison: Previous exam(s).

CLINICAL DATA: Screening.

EXAM:
DIGITAL SCREENING BILATERAL MAMMOGRAM WITH TOMO AND CAD

[R MLO synth-2D]
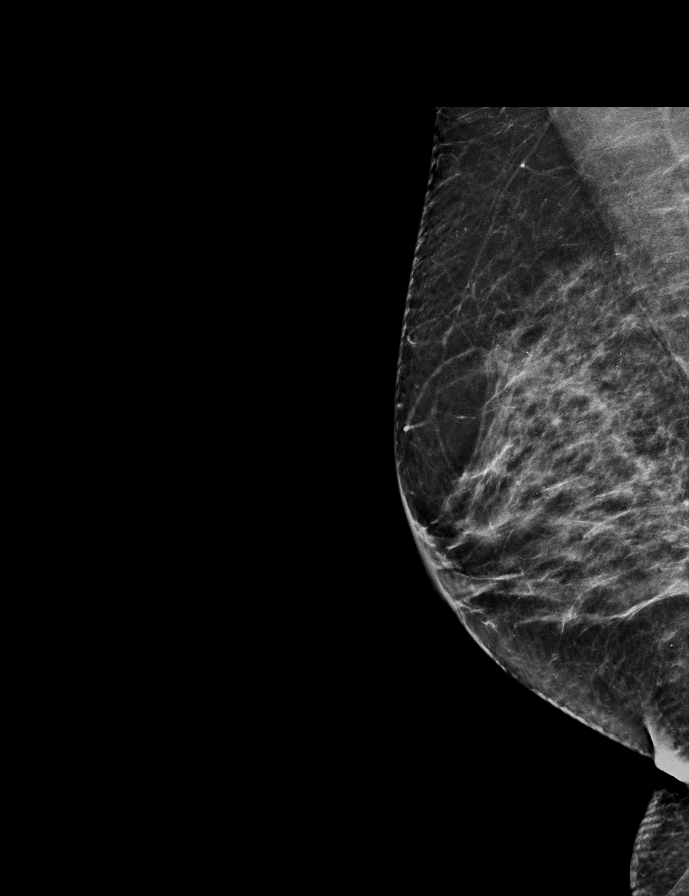

[L CC synth-2D]
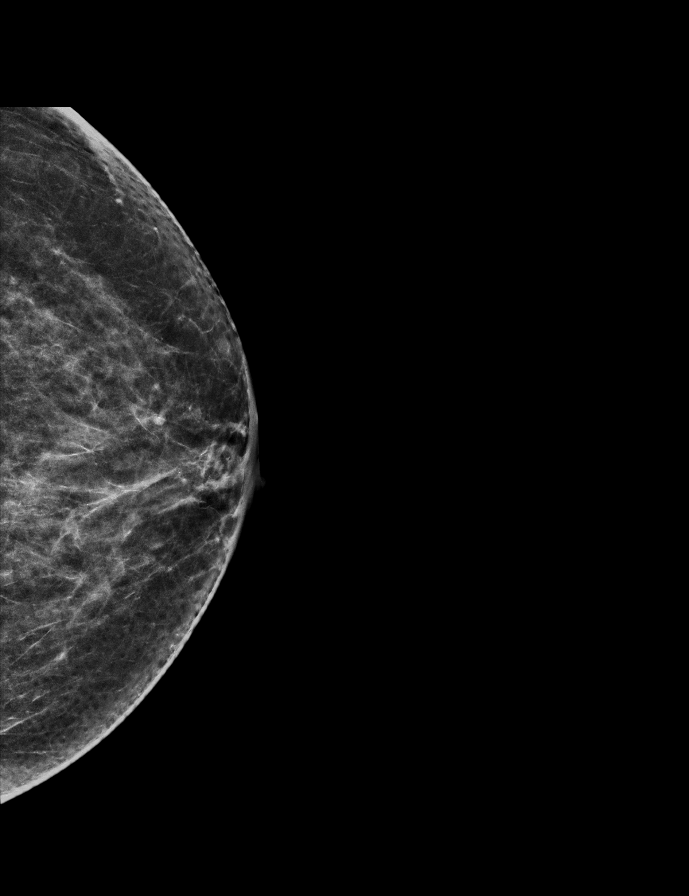

[L MLO synth-2D]
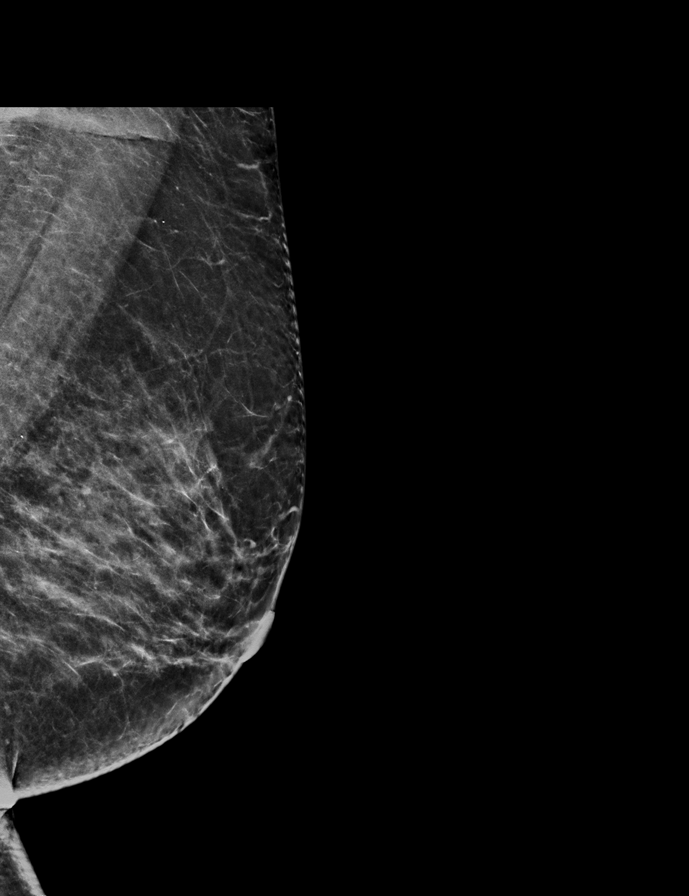

[R CC synth-2D]
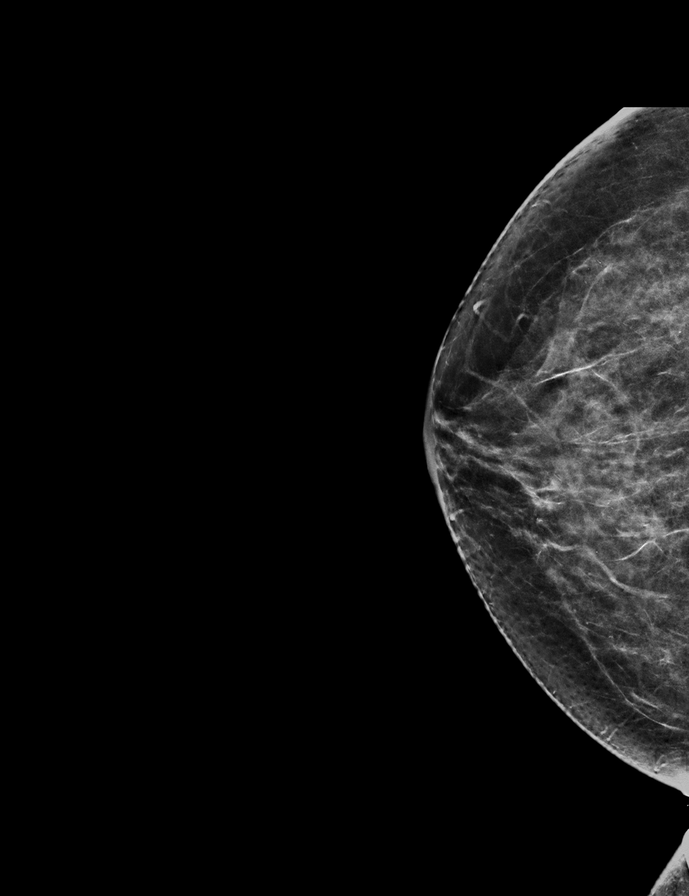

[R MLO tomo · 2 of 60 frames shown]
[frame 20/60]
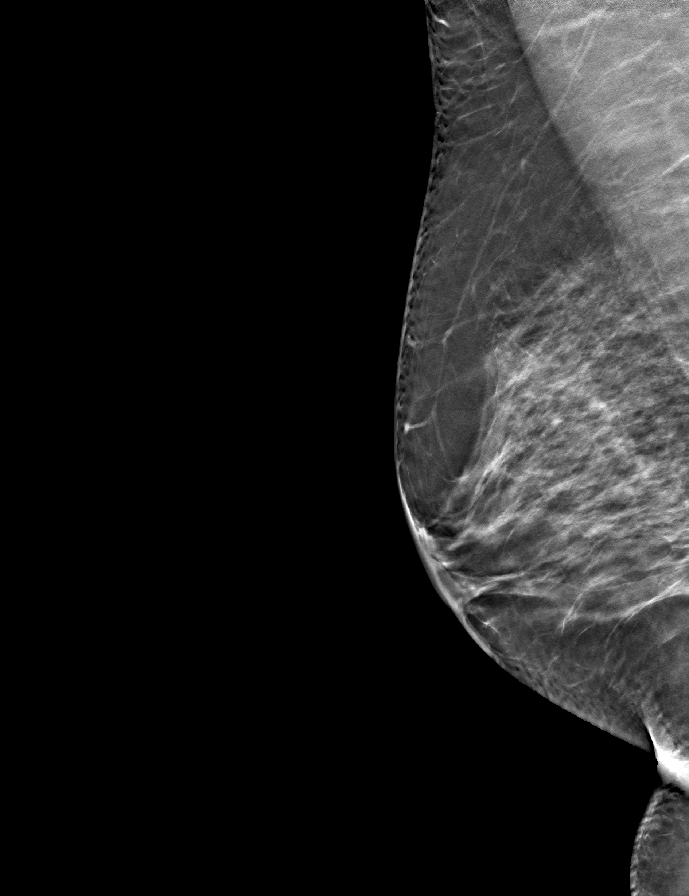
[frame 31/60]
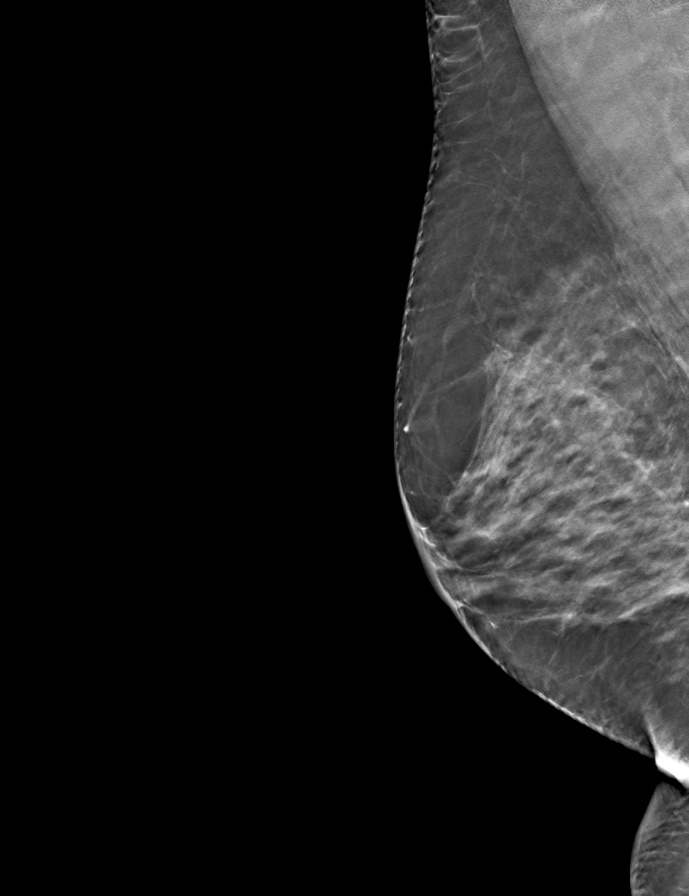

[R CC tomo · tomo slice 31/62.0]
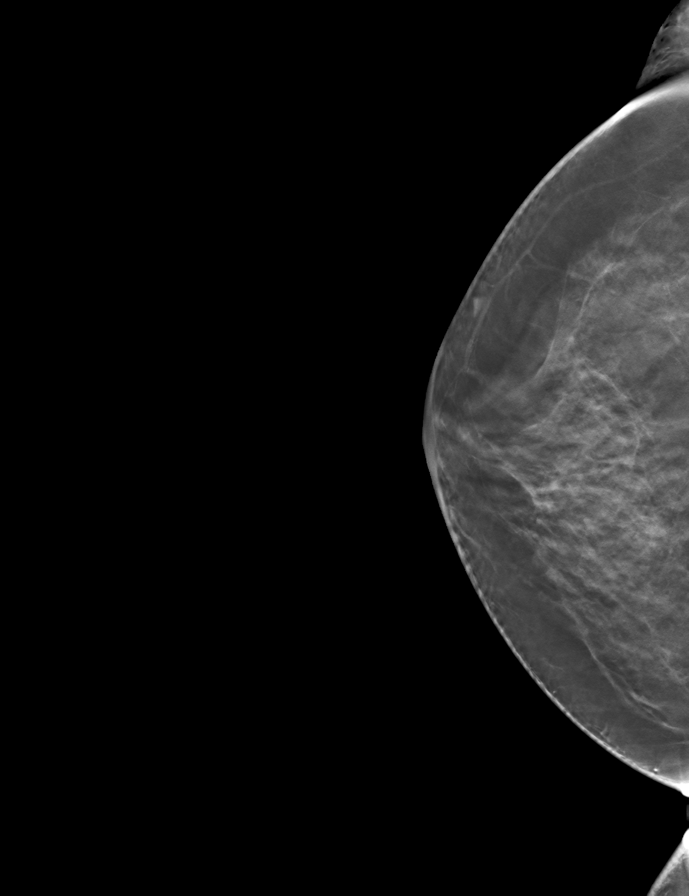

[L MLO tomo · tomo slice 32/63.0]
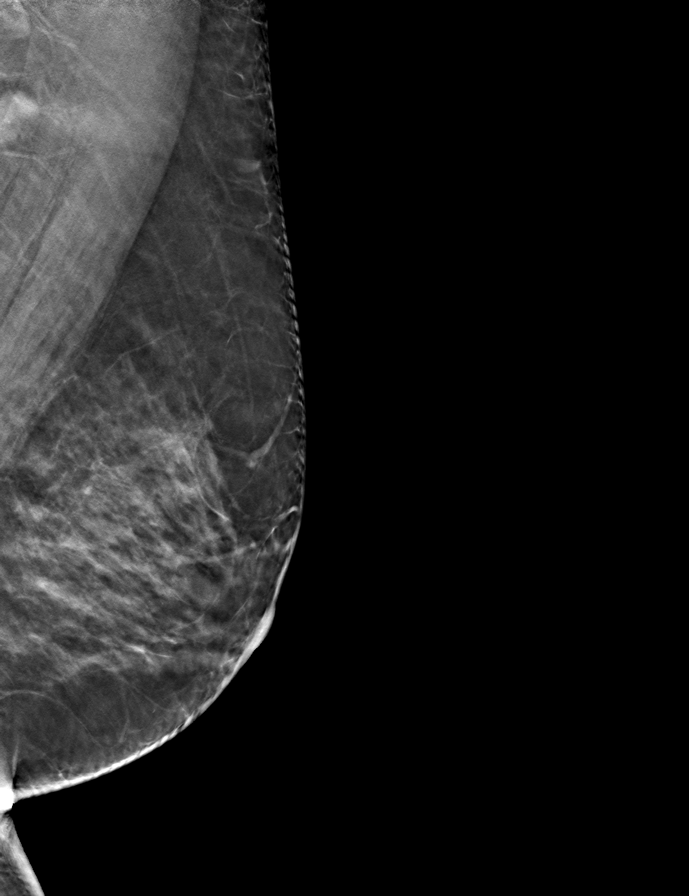

[L CC tomo · tomo slice 29/58.0]
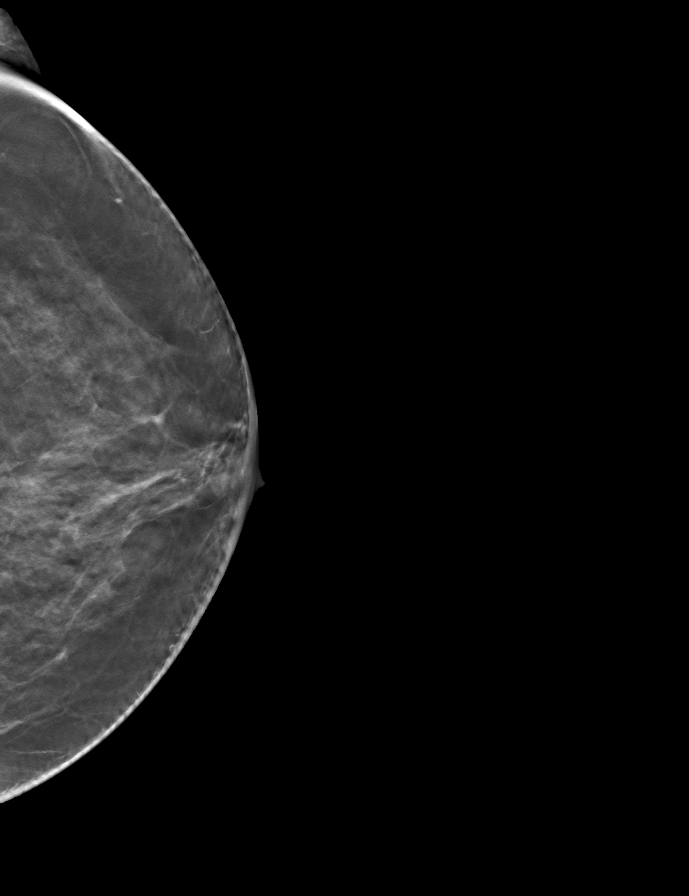

[9 of 24 positions shown; findings below may reference images not displayed]

ACR Breast Density Category c: The breast tissue is heterogeneously
dense, which may obscure small masses.
FINDINGS: There are no findings suspicious for malignancy. Images were
processed with CAD.
IMPRESSION: No mammographic evidence of malignancy. A result letter of this
screening mammogram will be mailed directly to the patient.

RECOMMENDATION:
Screening mammogram in one year. (Code:FT-[id number])

BI-RADS CATEGORY  1: Negative.

## 2020-05-04 DIAGNOSIS — M542 Cervicalgia: Secondary | ICD-10-CM | POA: Diagnosis not present

## 2020-05-04 DIAGNOSIS — M9902 Segmental and somatic dysfunction of thoracic region: Secondary | ICD-10-CM | POA: Diagnosis not present

## 2020-05-04 DIAGNOSIS — M9905 Segmental and somatic dysfunction of pelvic region: Secondary | ICD-10-CM | POA: Diagnosis not present

## 2020-05-04 DIAGNOSIS — M545 Low back pain: Secondary | ICD-10-CM | POA: Diagnosis not present

## 2020-05-04 DIAGNOSIS — M9903 Segmental and somatic dysfunction of lumbar region: Secondary | ICD-10-CM | POA: Diagnosis not present

## 2020-05-04 DIAGNOSIS — M9901 Segmental and somatic dysfunction of cervical region: Secondary | ICD-10-CM | POA: Diagnosis not present

## 2020-05-08 DIAGNOSIS — M9902 Segmental and somatic dysfunction of thoracic region: Secondary | ICD-10-CM | POA: Diagnosis not present

## 2020-05-08 DIAGNOSIS — M9903 Segmental and somatic dysfunction of lumbar region: Secondary | ICD-10-CM | POA: Diagnosis not present

## 2020-05-08 DIAGNOSIS — M9905 Segmental and somatic dysfunction of pelvic region: Secondary | ICD-10-CM | POA: Diagnosis not present

## 2020-05-08 DIAGNOSIS — M542 Cervicalgia: Secondary | ICD-10-CM | POA: Diagnosis not present

## 2020-05-08 DIAGNOSIS — M545 Low back pain: Secondary | ICD-10-CM | POA: Diagnosis not present

## 2020-05-08 DIAGNOSIS — M9901 Segmental and somatic dysfunction of cervical region: Secondary | ICD-10-CM | POA: Diagnosis not present

## 2020-05-11 ENCOUNTER — Ambulatory Visit: Payer: Medicare Other | Admitting: Cardiology

## 2020-05-11 DIAGNOSIS — M545 Low back pain: Secondary | ICD-10-CM | POA: Diagnosis not present

## 2020-05-11 DIAGNOSIS — M9903 Segmental and somatic dysfunction of lumbar region: Secondary | ICD-10-CM | POA: Diagnosis not present

## 2020-05-11 DIAGNOSIS — M542 Cervicalgia: Secondary | ICD-10-CM | POA: Diagnosis not present

## 2020-05-11 DIAGNOSIS — M9905 Segmental and somatic dysfunction of pelvic region: Secondary | ICD-10-CM | POA: Diagnosis not present

## 2020-05-11 DIAGNOSIS — M9902 Segmental and somatic dysfunction of thoracic region: Secondary | ICD-10-CM | POA: Diagnosis not present

## 2020-05-11 DIAGNOSIS — M9901 Segmental and somatic dysfunction of cervical region: Secondary | ICD-10-CM | POA: Diagnosis not present

## 2020-05-15 ENCOUNTER — Other Ambulatory Visit (INDEPENDENT_AMBULATORY_CARE_PROVIDER_SITE_OTHER): Payer: Self-pay | Admitting: Internal Medicine

## 2020-06-16 DIAGNOSIS — E785 Hyperlipidemia, unspecified: Secondary | ICD-10-CM | POA: Diagnosis not present

## 2020-06-16 DIAGNOSIS — E039 Hypothyroidism, unspecified: Secondary | ICD-10-CM | POA: Diagnosis not present

## 2020-06-16 DIAGNOSIS — E119 Type 2 diabetes mellitus without complications: Secondary | ICD-10-CM | POA: Diagnosis not present

## 2020-06-18 DIAGNOSIS — G43109 Migraine with aura, not intractable, without status migrainosus: Secondary | ICD-10-CM | POA: Diagnosis not present

## 2020-06-18 DIAGNOSIS — I10 Essential (primary) hypertension: Secondary | ICD-10-CM | POA: Diagnosis not present

## 2020-06-18 DIAGNOSIS — E785 Hyperlipidemia, unspecified: Secondary | ICD-10-CM | POA: Diagnosis not present

## 2020-06-18 DIAGNOSIS — R748 Abnormal levels of other serum enzymes: Secondary | ICD-10-CM | POA: Diagnosis not present

## 2020-06-18 DIAGNOSIS — Z0000 Encounter for general adult medical examination without abnormal findings: Secondary | ICD-10-CM | POA: Diagnosis not present

## 2020-06-18 DIAGNOSIS — F419 Anxiety disorder, unspecified: Secondary | ICD-10-CM | POA: Diagnosis not present

## 2020-06-18 DIAGNOSIS — E559 Vitamin D deficiency, unspecified: Secondary | ICD-10-CM | POA: Diagnosis not present

## 2020-06-18 DIAGNOSIS — R002 Palpitations: Secondary | ICD-10-CM | POA: Diagnosis not present

## 2020-06-18 DIAGNOSIS — F329 Major depressive disorder, single episode, unspecified: Secondary | ICD-10-CM | POA: Diagnosis not present

## 2020-06-18 DIAGNOSIS — E039 Hypothyroidism, unspecified: Secondary | ICD-10-CM | POA: Diagnosis not present

## 2020-06-18 DIAGNOSIS — E119 Type 2 diabetes mellitus without complications: Secondary | ICD-10-CM | POA: Diagnosis not present

## 2020-06-18 DIAGNOSIS — Z23 Encounter for immunization: Secondary | ICD-10-CM | POA: Diagnosis not present

## 2020-07-14 DIAGNOSIS — E785 Hyperlipidemia, unspecified: Secondary | ICD-10-CM | POA: Diagnosis not present

## 2020-07-14 DIAGNOSIS — E119 Type 2 diabetes mellitus without complications: Secondary | ICD-10-CM | POA: Diagnosis not present

## 2020-07-14 DIAGNOSIS — R748 Abnormal levels of other serum enzymes: Secondary | ICD-10-CM | POA: Diagnosis not present

## 2020-07-21 ENCOUNTER — Other Ambulatory Visit (INDEPENDENT_AMBULATORY_CARE_PROVIDER_SITE_OTHER): Payer: Self-pay | Admitting: Nurse Practitioner

## 2020-08-19 ENCOUNTER — Encounter: Payer: Self-pay | Admitting: Internal Medicine

## 2020-08-31 DIAGNOSIS — E039 Hypothyroidism, unspecified: Secondary | ICD-10-CM | POA: Diagnosis not present

## 2020-08-31 DIAGNOSIS — F419 Anxiety disorder, unspecified: Secondary | ICD-10-CM | POA: Diagnosis not present

## 2020-08-31 DIAGNOSIS — E785 Hyperlipidemia, unspecified: Secondary | ICD-10-CM | POA: Diagnosis not present

## 2020-08-31 DIAGNOSIS — I10 Essential (primary) hypertension: Secondary | ICD-10-CM | POA: Diagnosis not present

## 2020-08-31 DIAGNOSIS — E119 Type 2 diabetes mellitus without complications: Secondary | ICD-10-CM | POA: Diagnosis not present

## 2020-08-31 DIAGNOSIS — F32A Depression, unspecified: Secondary | ICD-10-CM | POA: Diagnosis not present

## 2020-08-31 DIAGNOSIS — R748 Abnormal levels of other serum enzymes: Secondary | ICD-10-CM | POA: Diagnosis not present

## 2020-09-18 DIAGNOSIS — Z23 Encounter for immunization: Secondary | ICD-10-CM | POA: Diagnosis not present

## 2020-10-23 DIAGNOSIS — M9902 Segmental and somatic dysfunction of thoracic region: Secondary | ICD-10-CM | POA: Diagnosis not present

## 2020-10-23 DIAGNOSIS — M25512 Pain in left shoulder: Secondary | ICD-10-CM | POA: Diagnosis not present

## 2020-10-23 DIAGNOSIS — M546 Pain in thoracic spine: Secondary | ICD-10-CM | POA: Diagnosis not present

## 2020-10-23 DIAGNOSIS — M62830 Muscle spasm of back: Secondary | ICD-10-CM | POA: Diagnosis not present

## 2020-10-23 DIAGNOSIS — M9905 Segmental and somatic dysfunction of pelvic region: Secondary | ICD-10-CM | POA: Diagnosis not present

## 2020-10-23 DIAGNOSIS — M9903 Segmental and somatic dysfunction of lumbar region: Secondary | ICD-10-CM | POA: Diagnosis not present

## 2020-10-23 DIAGNOSIS — M9907 Segmental and somatic dysfunction of upper extremity: Secondary | ICD-10-CM | POA: Diagnosis not present

## 2020-10-28 DIAGNOSIS — E119 Type 2 diabetes mellitus without complications: Secondary | ICD-10-CM | POA: Diagnosis not present

## 2020-10-28 DIAGNOSIS — E039 Hypothyroidism, unspecified: Secondary | ICD-10-CM | POA: Diagnosis not present

## 2020-10-28 DIAGNOSIS — R748 Abnormal levels of other serum enzymes: Secondary | ICD-10-CM | POA: Diagnosis not present

## 2020-10-30 DIAGNOSIS — M546 Pain in thoracic spine: Secondary | ICD-10-CM | POA: Diagnosis not present

## 2020-10-30 DIAGNOSIS — M9902 Segmental and somatic dysfunction of thoracic region: Secondary | ICD-10-CM | POA: Diagnosis not present

## 2020-10-30 DIAGNOSIS — M9903 Segmental and somatic dysfunction of lumbar region: Secondary | ICD-10-CM | POA: Diagnosis not present

## 2020-10-30 DIAGNOSIS — M9907 Segmental and somatic dysfunction of upper extremity: Secondary | ICD-10-CM | POA: Diagnosis not present

## 2020-10-30 DIAGNOSIS — M25512 Pain in left shoulder: Secondary | ICD-10-CM | POA: Diagnosis not present

## 2020-10-30 DIAGNOSIS — M62830 Muscle spasm of back: Secondary | ICD-10-CM | POA: Diagnosis not present

## 2020-10-30 DIAGNOSIS — M9905 Segmental and somatic dysfunction of pelvic region: Secondary | ICD-10-CM | POA: Diagnosis not present

## 2020-11-02 DIAGNOSIS — E039 Hypothyroidism, unspecified: Secondary | ICD-10-CM | POA: Diagnosis not present

## 2020-11-02 DIAGNOSIS — E119 Type 2 diabetes mellitus without complications: Secondary | ICD-10-CM | POA: Diagnosis not present

## 2020-11-02 DIAGNOSIS — K5900 Constipation, unspecified: Secondary | ICD-10-CM | POA: Diagnosis not present

## 2020-11-02 DIAGNOSIS — I10 Essential (primary) hypertension: Secondary | ICD-10-CM | POA: Diagnosis not present

## 2020-11-02 DIAGNOSIS — F32A Depression, unspecified: Secondary | ICD-10-CM | POA: Diagnosis not present

## 2020-11-02 DIAGNOSIS — R748 Abnormal levels of other serum enzymes: Secondary | ICD-10-CM | POA: Diagnosis not present

## 2020-11-02 DIAGNOSIS — E785 Hyperlipidemia, unspecified: Secondary | ICD-10-CM | POA: Diagnosis not present

## 2020-11-02 DIAGNOSIS — F419 Anxiety disorder, unspecified: Secondary | ICD-10-CM | POA: Diagnosis not present

## 2020-11-06 DIAGNOSIS — M9907 Segmental and somatic dysfunction of upper extremity: Secondary | ICD-10-CM | POA: Diagnosis not present

## 2020-11-06 DIAGNOSIS — M9903 Segmental and somatic dysfunction of lumbar region: Secondary | ICD-10-CM | POA: Diagnosis not present

## 2020-11-06 DIAGNOSIS — M9902 Segmental and somatic dysfunction of thoracic region: Secondary | ICD-10-CM | POA: Diagnosis not present

## 2020-11-06 DIAGNOSIS — M25512 Pain in left shoulder: Secondary | ICD-10-CM | POA: Diagnosis not present

## 2020-11-06 DIAGNOSIS — M546 Pain in thoracic spine: Secondary | ICD-10-CM | POA: Diagnosis not present

## 2020-11-06 DIAGNOSIS — M62830 Muscle spasm of back: Secondary | ICD-10-CM | POA: Diagnosis not present

## 2020-11-06 DIAGNOSIS — M9905 Segmental and somatic dysfunction of pelvic region: Secondary | ICD-10-CM | POA: Diagnosis not present

## 2020-11-13 DIAGNOSIS — M546 Pain in thoracic spine: Secondary | ICD-10-CM | POA: Diagnosis not present

## 2020-11-13 DIAGNOSIS — M9905 Segmental and somatic dysfunction of pelvic region: Secondary | ICD-10-CM | POA: Diagnosis not present

## 2020-11-13 DIAGNOSIS — M9903 Segmental and somatic dysfunction of lumbar region: Secondary | ICD-10-CM | POA: Diagnosis not present

## 2020-11-13 DIAGNOSIS — M62830 Muscle spasm of back: Secondary | ICD-10-CM | POA: Diagnosis not present

## 2020-11-13 DIAGNOSIS — M9902 Segmental and somatic dysfunction of thoracic region: Secondary | ICD-10-CM | POA: Diagnosis not present

## 2020-11-13 DIAGNOSIS — M9907 Segmental and somatic dysfunction of upper extremity: Secondary | ICD-10-CM | POA: Diagnosis not present

## 2020-11-13 DIAGNOSIS — M25512 Pain in left shoulder: Secondary | ICD-10-CM | POA: Diagnosis not present

## 2020-11-17 DIAGNOSIS — Z86010 Personal history of colonic polyps: Secondary | ICD-10-CM | POA: Diagnosis not present

## 2020-11-17 DIAGNOSIS — R112 Nausea with vomiting, unspecified: Secondary | ICD-10-CM | POA: Diagnosis not present

## 2020-11-17 DIAGNOSIS — Z9889 Other specified postprocedural states: Secondary | ICD-10-CM | POA: Diagnosis not present

## 2021-02-11 ENCOUNTER — Other Ambulatory Visit (HOSPITAL_COMMUNITY): Payer: Self-pay | Admitting: Family Medicine

## 2021-02-11 DIAGNOSIS — Z1231 Encounter for screening mammogram for malignant neoplasm of breast: Secondary | ICD-10-CM

## 2021-02-15 ENCOUNTER — Ambulatory Visit (HOSPITAL_COMMUNITY)
Admission: RE | Admit: 2021-02-15 | Discharge: 2021-02-15 | Disposition: A | Discharge status: Home / Self Care | Payer: Medicare Other | Source: Ambulatory Visit | Attending: Family Medicine | Admitting: Family Medicine

## 2021-02-15 ENCOUNTER — Other Ambulatory Visit (HOSPITAL_COMMUNITY): Payer: Self-pay | Admitting: Family Medicine

## 2021-02-15 DIAGNOSIS — Z1231 Encounter for screening mammogram for malignant neoplasm of breast: Secondary | ICD-10-CM | POA: Insufficient documentation

## 2021-02-16 ENCOUNTER — Other Ambulatory Visit (HOSPITAL_COMMUNITY): Payer: Self-pay | Admitting: Family Medicine

## 2021-02-17 ENCOUNTER — Other Ambulatory Visit (HOSPITAL_COMMUNITY): Payer: Self-pay | Admitting: Family Medicine

## 2021-02-17 DIAGNOSIS — R928 Other abnormal and inconclusive findings on diagnostic imaging of breast: Secondary | ICD-10-CM

## 2021-03-02 ENCOUNTER — Ambulatory Visit (HOSPITAL_COMMUNITY)
Admission: RE | Admit: 2021-03-02 | Discharge: 2021-03-02 | Disposition: A | Discharge status: Home / Self Care | Payer: Medicare Other | Source: Ambulatory Visit | Attending: Family Medicine | Admitting: Family Medicine

## 2021-03-02 ENCOUNTER — Other Ambulatory Visit: Payer: Self-pay

## 2021-03-02 ENCOUNTER — Other Ambulatory Visit (HOSPITAL_COMMUNITY): Payer: Self-pay | Admitting: Family Medicine

## 2021-03-02 DIAGNOSIS — R928 Other abnormal and inconclusive findings on diagnostic imaging of breast: Secondary | ICD-10-CM | POA: Diagnosis not present

## 2021-03-02 DIAGNOSIS — R921 Mammographic calcification found on diagnostic imaging of breast: Secondary | ICD-10-CM

## 2021-03-04 ENCOUNTER — Ambulatory Visit
Admission: RE | Admit: 2021-03-04 | Discharge: 2021-03-04 | Disposition: A | Discharge status: Home / Self Care | Payer: Medicare Other | Source: Ambulatory Visit | Attending: Family Medicine | Admitting: Family Medicine

## 2021-03-04 ENCOUNTER — Other Ambulatory Visit: Payer: Self-pay

## 2021-03-04 DIAGNOSIS — R928 Other abnormal and inconclusive findings on diagnostic imaging of breast: Secondary | ICD-10-CM

## 2021-03-04 DIAGNOSIS — R921 Mammographic calcification found on diagnostic imaging of breast: Secondary | ICD-10-CM

## 2021-03-11 ENCOUNTER — Other Ambulatory Visit: Payer: Self-pay

## 2021-03-11 ENCOUNTER — Ambulatory Visit: Admission: RE | Admit: 2021-03-11 | Payer: Medicare Other | Source: Ambulatory Visit

## 2021-03-15 ENCOUNTER — Ambulatory Visit
Admission: RE | Admit: 2021-03-15 | Discharge: 2021-03-15 | Disposition: A | Discharge status: Home / Self Care | Payer: Medicare Other | Source: Ambulatory Visit | Attending: Family Medicine | Admitting: Family Medicine

## 2021-03-15 ENCOUNTER — Other Ambulatory Visit: Payer: Self-pay

## 2021-03-16 ENCOUNTER — Encounter (HOSPITAL_COMMUNITY): Payer: Medicare Other

## 2021-03-16 ENCOUNTER — Other Ambulatory Visit (HOSPITAL_COMMUNITY): Payer: Medicare Other

## 2021-08-31 ENCOUNTER — Other Ambulatory Visit (HOSPITAL_COMMUNITY): Payer: Self-pay | Admitting: Family Medicine

## 2021-08-31 ENCOUNTER — Other Ambulatory Visit: Payer: Self-pay | Admitting: Family Medicine

## 2021-08-31 DIAGNOSIS — Z780 Asymptomatic menopausal state: Secondary | ICD-10-CM

## 2021-09-06 ENCOUNTER — Other Ambulatory Visit (HOSPITAL_COMMUNITY): Payer: Medicare Other

## 2022-05-24 ENCOUNTER — Ambulatory Visit (INDEPENDENT_AMBULATORY_CARE_PROVIDER_SITE_OTHER): Payer: Medicare Other | Admitting: Internal Medicine

## 2022-05-24 ENCOUNTER — Encounter (HOSPITAL_BASED_OUTPATIENT_CLINIC_OR_DEPARTMENT_OTHER): Payer: Self-pay | Admitting: Internal Medicine

## 2022-05-24 VITALS — BP 149/74 | HR 76 | Ht 63.0 in | Wt 176.0 lb

## 2022-05-24 DIAGNOSIS — R002 Palpitations: Secondary | ICD-10-CM

## 2022-05-24 DIAGNOSIS — E7849 Other hyperlipidemia: Secondary | ICD-10-CM | POA: Diagnosis not present

## 2022-05-24 DIAGNOSIS — I10 Essential (primary) hypertension: Secondary | ICD-10-CM | POA: Diagnosis not present

## 2022-05-24 DIAGNOSIS — E119 Type 2 diabetes mellitus without complications: Secondary | ICD-10-CM

## 2022-05-24 MED ORDER — METOPROLOL TARTRATE 25 MG PO TABS: 25.0000 mg | ORAL_TABLET | Freq: Two times a day (BID) | ORAL | 1 refills | Status: DC

## 2022-05-24 NOTE — Patient Instructions (Signed)
Medication Instructions:  INCREASE metoprolol tartrate to '50mg'$  twice daily  Dr. Debara Pickett has recommended [facility]   Your Leqvio injection is scheduled on _______ at ________. Please arrive at [facility] and enter the hospital through [facility] [employer] that's located at [employer] 15 minutes before your scheduled injection time. Walk in to the registration desk and let them know that you are scheduled for an injection in the infusion center. They will register you and take you to your appointment.   Marion Downer is a subcutaneous injection that will lower your LDL cholesterol by 50%. If this is your first injection, your next injection will be due in 3 months. If this is NOT your first injection, your next injection will be due in 6 months. If you have not heard from [facility] to schedule your next appointment within 2 weeks of your next anticipated injection, please call [facility] to schedule this at:                [phone] - if you are seen at the [facility] office               [phone] - if you are seen at the [facility] office     *If you need a refill on your cardiac medications before your next appointment, please call your pharmacy*   Lab Work: FASTING lab work to check cholesterol in about 5-6 months  If you have labs (blood work) drawn today and your tests are completely normal, you will receive your results only by: [place] (if you have [place]) OR A paper copy in the mail If you have any lab test that is abnormal or we need to change your treatment, we will call you to review the results.   Testing/Procedures: Dr. Debara Pickett has ordered a CT coronary calcium score.   Test locations:  [place]   This is $99 out of pocket.   Coronary CalciumScan A coronary calcium scan is an imaging test used to look for deposits of calcium and other fatty materials (plaques) in the inner lining  of the blood vessels of the heart (coronary arteries). These deposits of calcium and plaques can partly clog and narrow the coronary arteries without producing any symptoms or warning signs. This puts a person at risk for a heart attack. This test can detect these deposits before symptoms develop. Tell a health care provider about: Any allergies you have. All medicines you are taking, including vitamins, herbs, eye drops, creams, and over-the-counter medicines. Any problems you or family members have had with anesthetic medicines. Any blood disorders you have. Any surgeries you have had. Any medical conditions you have. Whether you are pregnant or may be pregnant. What are the risks? Generally, this is a safe procedure. However, problems may occur, including: Harm to a pregnant woman and her unborn baby. This test involves the use of radiation. Radiation exposure can be dangerous to a pregnant woman and her unborn baby. If you are pregnant, you generally should not have this procedure done. Slight increase in the risk of cancer. This is because of the radiation involved in the test. What happens before the procedure? No preparation is needed for this procedure. What happens during the procedure? You will undress and remove any jewelry around your neck or chest. You will put on a hospital gown. Sticky electrodes will be placed on your chest. The electrodes will be connected to an electrocardiogram (ECG)  machine to record a tracing of the electrical activity of your heart. A CT scanner will take pictures of your heart. During this time, you will be asked to lie still and hold your breath for 2-3 seconds while a picture of your heart is being taken. The procedure may vary among health care providers and hospitals. What happens after the procedure? You can get dressed. You can return to your normal activities. It is up to you to get the results of your test. Ask your health care provider, or the  department that is doing the test, when your results will be ready. Summary A coronary calcium scan is an imaging test used to look for deposits of calcium and other fatty materials (plaques) in the inner lining of the blood vessels of the heart (coronary arteries). Generally, this is a safe procedure. Tell your health care provider if you are pregnant or may be pregnant. No preparation is needed for this procedure. A CT scanner will take pictures of your heart. You can return to your normal activities after the scan is done. This information is not intended to replace advice given to you by your health care provider. Make sure you discuss any questions you have with your health care provider. Document Released: 03/10/2008 Document Revised: 08/01/2016 Document Reviewed: 08/01/2016 Elsevier Interactive Patient Education  2017 [place]: At [facility], you and your health needs are our priority.  As part of our continuing mission to provide you with exceptional heart care, we have created designated Provider Care Teams.  These Care Teams include your primary Cardiologist (physician) and Advanced Practice Providers (APPs -  Physician Assistants and Nurse Practitioners) who all work together to provide you with the care you need, when you need it.  We recommend signing up for the patient portal called "MyChart".  Sign up information is provided on this After Visit Summary.  MyChart is used to connect with patients for Virtual Visits (Telemedicine).  Patients are able to view lab/test results, encounter notes, upcoming appointments, etc.  Non-urgent messages can be sent to your provider as well.   To learn more about what you can do with MyChart, go to [email or web address].    Your next appointment:    5-6 months with Dr. Debara Pickett

## 2022-05-24 NOTE — Progress Notes (Signed)
LIPID CLINIC CONSULT NOTE  Chief Complaint:  Manage dyslipidemia, palpitations  Primary Care Physician: Percell Belt, DO  Primary Cardiologist:  Carlyle Dolly, MD  HPI:  Megan Hill is a 71 y.o. female who is being seen today for the evaluation of dyslipidemia/palpitations at the request of Lazoff, Shawn P, DO.  This is a pleasant 71 year old female who has a history of hypertension, palpitations and thyroid disease as well as type 2 diabetes.  She has a longstanding history of high cholesterol but has been statin intolerant having tried numerous statins before with significant myalgias.  She is currently on no lipid-lowering therapy.  She has previously seen Dr. Harl Bowie in our group for palpitations.  She was placed on metoprolol and has had some improvement in her symptoms but still is having some breakthrough palpitations.  She also takes liothyronine and NP thyroid.  Labs recently showed total cholesterol 256, triglycerides 233, HDL 52 and direct LDL 181.  She reports a varied diet but says she could try to further reduce saturated fat intake.  She also reports she has had a recent increase in her palpitations and feels like her current dose of metoprolol is not as effective as it was.  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 She has never used smokeless tobacco. She reports that she does not drink alcohol and does not use drugs.  ALLERGIES:  Allergies  Allergen Reactions   Metformin And Related Diarrhea   Penicillins     Has patient had a PCN reaction causing immediate rash, facial/tongue/throat swelling, SOB or lightheadedness with hypotension: Yes Has patient had a PCN reaction causing severe rash involving mucus membranes or skin necrosis: No Has patient had a PCN reaction that required hospitalization: No Has patient had a PCN reaction occurring within the last 10 years: No If all of the above answers are "NO", then may proceed with Cephalosporin use.    ROS: Pertinent items noted in HPI and remainder of comprehensive ROS otherwise negative.  HOME MEDS: Current Outpatient Medications on File Prior to Visit  Medication Sig Dispense Refill   Ascorbic Acid (VITAMIN C) 1000 MG tablet Take 2,000 mg by mouth 2 (two) times a day.      aspirin EC 81 MG tablet Take 81 mg by mouth daily.     Cholecalciferol (VITAMIN D3) 5000 units CAPS Take 5,000 Units by mouth 2 (two) times a day.  Cyanocobalamin (VITAMIN B-12) 2500 MCG SUBL Take by mouth daily.     liothyronine (CYTOMEL) 5 MCG tablet TAKE 1 TABLET(5 MCG) BY MOUTH DAILY 30 tablet 3   loratadine (CLARITIN) 10 MG tablet Take 10 mg by mouth daily.     NP THYROID 90 MG tablet TAKE 1 TABLET BY MOUTH EVERY DAY 30 tablet 3   hydrochlorothiazide (HYDRODIURIL) 12.5 MG tablet TAKE 1 TABLET BY MOUTH DAILY 90 tablet 0   losartan (COZAAR) 50 MG tablet TAKE 1 TABLET BY MOUTH DAILY 90 tablet 0   No current facility-administered medications on file prior to visit.    LABS/IMAGING: No results found for this or any  previous visit (from the past 48 hour(s)). No results found.  LIPID PANEL: No results found for: "CHOL", "TRIG", "HDL", "CHOLHDL", "VLDL", "LDLCALC", "LDLDIRECT"  WEIGHTS: Wt Readings from Last 3 Encounters:  05/24/22 176 lb (79.8 kg)  11/25/19 175 lb 9.6 oz (79.7 kg)  10/21/19 179 lb (81.2 kg)    VITALS: BP (!) 149/74   Pulse 76   Ht '5\' 3"'$  (1.6 m)   Wt 176 lb (79.8 kg)   BMI 31.18 kg/m   EXAM: Deferred  EKG: Deferred  ASSESSMENT: Mixed dyslipidemia with possible familial hyperlipidemia, goal LDL less than 70 Type 2 diabetes Hypothyroidism Hypertension Palpitations  PLAN: 1.   Megan Hill has a mixed dyslipidemia with a high LDL cholesterol, although her LDL is not greater than 190, this is suggestive of a familial hyperlipidemia.Marland Kitchen  Unfortunately she has been intolerant to statins.  She is a type II diabetic with additional risk factors of hypertension and hypothyroidism.  She will need significant further lipid-lowering including at least 50% reduction in her cholesterol.  I would recommend a PCSK9 inhibitor which would help her achieve that.  We will reach out for prior authorization and plan to repeat a lipid NMR and LP(a) in about 3 to 4 months.  Based on her insurance, she may actually be a good candidate for Leqvio.  We will see if we can get prior authorization and arrange for injections at the outpatient infusion center.  With regard to her breakthrough palpitations.  I will increase her metoprolol to 50 mg twice daily.  Thanks again for the kind referral.  Pixie Casino, MD, FACC, [place] Director of the [facility] Lipid Disorders &  Cardiovascular Risk Reduction Clinic Diplomate of the American Board of Clinical Lipidology Attending Cardiologist  Direct Dial: [phone]  Fax: [phone]  Website:  [email or web address] 05/24/2022, 9:06 PM

## 2022-05-25 ENCOUNTER — Telehealth: Payer: Self-pay | Admitting: Internal Medicine

## 2022-05-25 NOTE — Telephone Encounter (Signed)
Leqvio start form faxed to [phone]  Dx: E27.01  Insurance: medicare with supplement plan

## 2022-05-26 LAB — NMR, LIPOPROFILE
Cholesterol, Total: 251 mg/dL — ABNORMAL HIGH (ref 100–199)
HDL Particle Number: 32 umol/L (ref >=30.5)
HDL-C: 49 mg/dL (ref >39)
LDL Particle Number: 2298 nmol/L — ABNORMAL HIGH (ref <1000)
LDL Size: 20.7 nm (ref >20.5)
LDL-C (NIH Calc): 157 mg/dL — ABNORMAL HIGH (ref 0–99)
LP-IR Score: 72 — ABNORMAL HIGH (ref <=45)
Small LDL Particle Number: 1242 nmol/L — ABNORMAL HIGH (ref <=527)
Triglycerides: 246 mg/dL — ABNORMAL HIGH (ref 0–149)

## 2022-05-26 LAB — LIPOPROTEIN A (LPA): Lipoprotein (a): <8.4 nmol/L (ref <75.0)

## 2022-06-02 NOTE — Telephone Encounter (Signed)
Waiting on benefits investigation info  

## 2022-06-09 ENCOUNTER — Encounter: Payer: Self-pay | Admitting: Internal Medicine

## 2022-06-10 ENCOUNTER — Other Ambulatory Visit: Payer: Self-pay | Admitting: Internal Medicine

## 2022-06-10 DIAGNOSIS — E7849 Other hyperlipidemia: Secondary | ICD-10-CM

## 2022-06-10 NOTE — Telephone Encounter (Signed)
Patient has been identified as candidate for Leqvio  Benefits investigation enrollment completed on 05/26/22 - received via fax on 06/08/22  Benefits investigation report notes the following:  Type of insurance: medicare A/B and supplement plan G  OOP Max: n/a / Met: n/a  Deductible: $226 - met  Co-insurance: 20%  PA required: N  PA phone number: n/a  Benefits Summary Details:  Medicare Part B covers Leqvio at 80% after patient meets deductible of $226. Once deductible med, patient responsible for 20% coinsurance. Deductible has been met for 2023. No PA required. Secondary part G covers part B coninsurance and 100% of excess charges. This plan DOES NOT cover Medicare Part B deductible.   

## 2022-06-10 NOTE — Telephone Encounter (Signed)
Sent patient message in [place] with update Need to coordinate 1st Leqvio injection

## 2022-06-14 NOTE — Telephone Encounter (Signed)
1st Leqvio injection scheduled 9/25

## 2022-06-17 ENCOUNTER — Other Ambulatory Visit (HOSPITAL_COMMUNITY): Payer: Self-pay | Admitting: Family Medicine

## 2022-06-17 DIAGNOSIS — Z1231 Encounter for screening mammogram for malignant neoplasm of breast: Secondary | ICD-10-CM

## 2022-06-20 ENCOUNTER — Ambulatory Visit (HOSPITAL_COMMUNITY)
Admission: RE | Admit: 2022-06-20 | Discharge: 2022-06-20 | Disposition: A | Discharge status: Home / Self Care | Payer: Medicare Other | Source: Ambulatory Visit | Attending: Internal Medicine | Admitting: Internal Medicine

## 2022-06-20 DIAGNOSIS — E7849 Other hyperlipidemia: Secondary | ICD-10-CM | POA: Diagnosis present

## 2022-06-20 MED ORDER — INCLISIRAN SODIUM 284 MG/1.5ML ~~LOC~~ SOSY
PREFILLED_SYRINGE | SUBCUTANEOUS | Status: DC
Start: 2022-06-20 — End: 2022-06-20
  Filled 2022-06-20: qty 1.5

## 2022-06-20 MED ORDER — INCLISIRAN SODIUM 284 MG/1.5ML ~~LOC~~ SOSY
284.0000 mg | PREFILLED_SYRINGE | Freq: Once | SUBCUTANEOUS | Status: AC
  Administered 2022-06-20: 284 mg via SUBCUTANEOUS

## 2022-06-20 MED ORDER — INCLISIRAN SODIUM 284 MG/1.5ML ~~LOC~~ SOSY
PREFILLED_SYRINGE | SUBCUTANEOUS | Status: AC
  Filled 2022-06-20: qty 1.5

## 2022-06-22 ENCOUNTER — Ambulatory Visit (HOSPITAL_COMMUNITY)
Admission: RE | Admit: 2022-06-22 | Discharge: 2022-06-22 | Disposition: A | Discharge status: Home / Self Care | Payer: Medicare Other | Source: Ambulatory Visit | Attending: Family Medicine | Admitting: Family Medicine

## 2022-06-22 DIAGNOSIS — Z1231 Encounter for screening mammogram for malignant neoplasm of breast: Secondary | ICD-10-CM | POA: Diagnosis not present

## 2022-07-11 ENCOUNTER — Other Ambulatory Visit: Payer: Self-pay | Admitting: Nurse Practitioner

## 2022-07-11 DIAGNOSIS — K7581 Nonalcoholic steatohepatitis (NASH): Secondary | ICD-10-CM

## 2022-07-11 DIAGNOSIS — R7401 Elevation of levels of liver transaminase levels: Secondary | ICD-10-CM

## 2022-07-14 ENCOUNTER — Ambulatory Visit
Admission: RE | Admit: 2022-07-14 | Discharge: 2022-07-14 | Disposition: A | Discharge status: Home / Self Care | Payer: Medicare Other | Source: Ambulatory Visit | Attending: Nurse Practitioner | Admitting: Nurse Practitioner

## 2022-07-14 DIAGNOSIS — R7401 Elevation of levels of liver transaminase levels: Secondary | ICD-10-CM

## 2022-07-14 DIAGNOSIS — K7581 Nonalcoholic steatohepatitis (NASH): Secondary | ICD-10-CM

## 2022-08-23 ENCOUNTER — Other Ambulatory Visit: Payer: Self-pay

## 2022-08-23 ENCOUNTER — Emergency Department (HOSPITAL_COMMUNITY)
Admission: EM | Admit: 2022-08-23 | Discharge: 2022-08-23 | Disposition: A | Discharge status: Home / Self Care | Payer: Medicare Other | Attending: Emergency Medicine | Admitting: Emergency Medicine

## 2022-08-23 ENCOUNTER — Encounter (HOSPITAL_COMMUNITY): Payer: Self-pay | Admitting: Emergency Medicine

## 2022-08-23 ENCOUNTER — Emergency Department (HOSPITAL_COMMUNITY): Payer: Medicare Other

## 2022-08-23 DIAGNOSIS — I10 Essential (primary) hypertension: Secondary | ICD-10-CM | POA: Insufficient documentation

## 2022-08-23 DIAGNOSIS — E119 Type 2 diabetes mellitus without complications: Secondary | ICD-10-CM | POA: Diagnosis not present

## 2022-08-23 DIAGNOSIS — Z79899 Other long term (current) drug therapy: Secondary | ICD-10-CM | POA: Insufficient documentation

## 2022-08-23 DIAGNOSIS — Z7984 Long term (current) use of oral hypoglycemic drugs: Secondary | ICD-10-CM | POA: Insufficient documentation

## 2022-08-23 DIAGNOSIS — Z7982 Long term (current) use of aspirin: Secondary | ICD-10-CM | POA: Diagnosis not present

## 2022-08-23 DIAGNOSIS — R197 Diarrhea, unspecified: Secondary | ICD-10-CM | POA: Insufficient documentation

## 2022-08-23 DIAGNOSIS — R1084 Generalized abdominal pain: Secondary | ICD-10-CM | POA: Insufficient documentation

## 2022-08-23 LAB — COMPREHENSIVE METABOLIC PANEL
ALT: 42 U/L (ref 0–44)
AST: 22 U/L (ref 15–41)
Albumin: 4 g/dL (ref 3.5–5.0)
Alkaline Phosphatase: 58 U/L (ref 38–126)
Anion gap: 8 (ref 5–15)
BUN: 13 mg/dL (ref 8–23)
CO2: 26 mmol/L (ref 22–32)
Calcium: 8.5 mg/dL — ABNORMAL LOW (ref 8.9–10.3)
Chloride: 100 mmol/L (ref 98–111)
Creatinine, Ser: 0.86 mg/dL (ref 0.44–1.00)
GFR, Estimated: >60 mL/min (ref >60)
Glucose, Bld: 116 mg/dL — ABNORMAL HIGH (ref 70–99)
Potassium: 3.2 mmol/L — ABNORMAL LOW (ref 3.5–5.1)
Sodium: 134 mmol/L — ABNORMAL LOW (ref 135–145)
Total Bilirubin: 1 mg/dL (ref 0.3–1.2)
Total Protein: 6.9 g/dL (ref 6.5–8.1)

## 2022-08-23 LAB — CBC WITH DIFFERENTIAL/PLATELET
Abs Immature Granulocytes: 0.02 10*3/uL (ref 0.00–0.07)
Basophils Absolute: 0 10*3/uL (ref 0.0–0.1)
Basophils Relative: 0 %
Eosinophils Absolute: 0.4 10*3/uL (ref 0.0–0.5)
Eosinophils Relative: 4 %
HCT: 50.3 % — ABNORMAL HIGH (ref 36.0–46.0)
Hemoglobin: 17.3 g/dL — ABNORMAL HIGH (ref 12.0–15.0)
Immature Granulocytes: 0 %
Lymphocytes Relative: 22 %
Lymphs Abs: 1.9 10*3/uL (ref 0.7–4.0)
MCH: 31.4 pg (ref 26.0–34.0)
MCHC: 34.4 g/dL (ref 30.0–36.0)
MCV: 91.3 fL (ref 80.0–100.0)
Monocytes Absolute: 0.7 10*3/uL (ref 0.1–1.0)
Monocytes Relative: 8 %
Neutro Abs: 5.7 10*3/uL (ref 1.7–7.7)
Neutrophils Relative %: 66 %
Platelets: 260 10*3/uL (ref 150–400)
RBC: 5.51 MIL/uL — ABNORMAL HIGH (ref 3.87–5.11)
RDW: 12.6 % (ref 11.5–15.5)
WBC: 8.8 10*3/uL (ref 4.0–10.5)
nRBC: 0 % (ref 0.0–0.2)

## 2022-08-23 LAB — LIPASE, BLOOD: Lipase: 28 U/L (ref 11–51)

## 2022-08-23 LAB — CBG MONITORING, ED: Glucose-Capillary: 152 mg/dL — ABNORMAL HIGH (ref 70–99)

## 2022-08-23 MED ORDER — IOHEXOL 300 MG/ML  SOLN
100.0000 mL | Freq: Once | INTRAMUSCULAR | Status: AC | PRN
  Administered 2022-08-23: 100 mL via INTRAVENOUS

## 2022-08-23 MED ORDER — SODIUM CHLORIDE 0.9 % IV SOLN: INTRAVENOUS | Status: DC

## 2022-08-23 MED ORDER — SODIUM CHLORIDE 0.9 % IV BOLUS
500.0000 mL | Freq: Once | INTRAVENOUS | Status: AC
  Administered 2022-08-23: 500 mL via INTRAVENOUS

## 2022-08-23 MED ORDER — LOPERAMIDE HCL 2 MG PO CAPS: 2.0000 mg | ORAL_CAPSULE | Freq: Four times a day (QID) | ORAL | 0 refills | Status: AC | PRN

## 2022-08-23 MED ORDER — ONDANSETRON HCL 4 MG/2ML IJ SOLN
4.0000 mg | Freq: Once | INTRAMUSCULAR | Status: AC
  Administered 2022-08-23: 4 mg via INTRAVENOUS
  Filled 2022-08-23: qty 2

## 2022-08-23 MED ORDER — ONDANSETRON 4 MG PO TBDP: 4.0000 mg | ORAL_TABLET | Freq: Three times a day (TID) | ORAL | 1 refills | Status: AC | PRN

## 2022-08-23 NOTE — ED Triage Notes (Signed)
Pt c/o weakness, diarrhea, and weakness since Saturday.

## 2022-08-23 NOTE — Discharge Instructions (Signed)
Workup here today without any acute findings.  Take the Zofran as needed for nausea.  Take the Imodium as needed for diarrhea.  Make an appointment to follow-up with your doctor.  To discuss the changes in your diabetic meds.  Labs today without any significant abnormalities.  CT scan without any acute findings which is reassuring.

## 2022-08-23 NOTE — ED Provider Notes (Signed)
[facility] EMERGENCY DEPARTMENT Provider Note   CSN: [id number] Arrival date & time: 08/23/22  7322     History  Chief Complaint  Patient presents with   Diarrhea    Megan Hill is a 71 y.o. female.   Patient with a complaint of some generalized abdominal pain diarrhea frequent amounts no blood in it.  And nausea but no vomiting.  No one else sick at home.  Onset was on Saturday.  Patient did have her diabetic medicines adjusted where she started had a change in her glipizide and the Ozempic and that was just on Tuesday of last week.  She is wondering if that is having some effect.  Patient is not on any antibiotics.  No fevers.  Generalized weakness.  Patient not on blood thinners.  Past medical history significant for history of diverticulitis diabetes without complication hypertension.  Past surgery includes appendectomy cholecystectomy and abdominal hysterectomy.  Patient is not a tobacco user.       Home Medications Prior to Admission medications   Medication Sig Start Date End Date Taking? Authorizing Provider  loperamide (IMODIUM) 2 MG capsule Take 1 capsule (2 mg total) by mouth 4 (four) times daily as needed for diarrhea or loose stools. 08/23/22  Yes Fredia Sorrow, MD  ondansetron (ZOFRAN-ODT) 4 MG disintegrating tablet Take 1 tablet (4 mg total) by mouth every 8 (eight) hours as needed for nausea or vomiting. 08/23/22  Yes Fredia Sorrow, MD  Ascorbic Acid (VITAMIN C) 1000 MG tablet Take 2,000 mg by mouth 2 (two) times a day.     [provider]  aspirin EC 81 MG tablet Take 81 mg by mouth daily.    [provider]  Cholecalciferol (VITAMIN D3) 5000 units CAPS Take 5,000 Units by mouth 2 (two) times a day.     [provider]  Cyanocobalamin (VITAMIN B-12) 2500 MCG SUBL Take by mouth daily.    [provider]  hydrochlorothiazide (HYDRODIURIL) 12.5 MG tablet TAKE 1 TABLET BY MOUTH DAILY 01/16/20 04/15/20  Hurshel Party C, MD   liothyronine (CYTOMEL) 5 MCG tablet TAKE 1 TABLET(5 MCG) BY MOUTH DAILY 03/22/20   Hurshel Party C, MD  loratadine (CLARITIN) 10 MG tablet Take 10 mg by mouth daily.    [provider]  losartan (COZAAR) 50 MG tablet TAKE 1 TABLET BY MOUTH DAILY 04/15/20 07/14/20  Ailene Ards, NP  metoprolol tartrate (LOPRESSOR) 25 MG tablet Take 1 tablet (25 mg total) by mouth 2 (two) times daily. 05/24/22 11/20/22  Pixie Casino, MD  NP THYROID 90 MG tablet TAKE 1 TABLET BY MOUTH EVERY DAY 05/18/20   Doree Albee, MD      Allergies    Metformin and related and Penicillins    Review of Systems   Review of Systems  Constitutional:  Positive for fatigue. Negative for chills and fever.  HENT:  Negative for ear pain and sore throat.   Eyes:  Negative for pain and visual disturbance.  Respiratory:  Negative for cough and shortness of breath.   Cardiovascular:  Negative for chest pain and palpitations.  Gastrointestinal:  Positive for abdominal pain, diarrhea and nausea. Negative for vomiting.  Genitourinary:  Negative for dysuria and hematuria.  Musculoskeletal:  Negative for arthralgias and back pain.  Skin:  Negative for color change and rash.  Neurological:  Negative for seizures and syncope.  All other systems reviewed and are negative.   Physical Exam Updated Vital Signs BP 105/69 (BP Location:  Left Arm)   Pulse 79   Temp 98.1 F (36.7 C) (Oral)   Resp 14   Ht 1.626 m ('5\' 4"'$ )   Wt 73.9 kg   SpO2 98%   BMI 27.98 kg/m  Physical Exam Vitals and nursing note reviewed.  Constitutional:      General: She is not in acute distress.    Appearance: Normal appearance. She is well-developed.  HENT:     Head: Normocephalic and atraumatic.     Mouth/Throat:     Mouth: Mucous membranes are dry.  Eyes:     Conjunctiva/sclera: Conjunctivae normal.     Pupils: Pupils are equal, round, and reactive to light.  Cardiovascular:     Rate and Rhythm: Normal rate and regular rhythm.      Heart sounds: No murmur heard. Pulmonary:     Effort: Pulmonary effort is normal. No respiratory distress.     Breath sounds: Normal breath sounds.  Abdominal:     General: There is no distension.     Palpations: Abdomen is soft.     Tenderness: There is no abdominal tenderness. There is no guarding.  Musculoskeletal:        General: No swelling.     Cervical back: Normal range of motion and neck supple.     Right lower leg: No edema.     Left lower leg: No edema.  Skin:    General: Skin is warm and dry.     Capillary Refill: Capillary refill takes less than 2 seconds.  Neurological:     General: No focal deficit present.     Mental Status: She is alert and oriented to person, place, and time.  Psychiatric:        Mood and Affect: Mood normal.     ED Results / Procedures / Treatments   Labs (all labs ordered are listed, but only abnormal results are displayed) Labs Reviewed  COMPREHENSIVE METABOLIC PANEL - Abnormal; Notable for the following components:      Result Value   Sodium 134 (*)    Potassium 3.2 (*)    Glucose, Bld 116 (*)    Calcium 8.5 (*)    All other components within normal limits  CBC WITH DIFFERENTIAL/PLATELET - Abnormal; Notable for the following components:   RBC 5.51 (*)    Hemoglobin 17.3 (*)    HCT 50.3 (*)    All other components within normal limits  CBG MONITORING, ED - Abnormal; Notable for the following components:   Glucose-Capillary 152 (*)    All other components within normal limits  LIPASE, BLOOD    EKG None  Radiology CT Abdomen Pelvis W Contrast  Result Date: 08/23/2022 CLINICAL DATA:  Abdominal pain EXAM: CT ABDOMEN AND PELVIS WITH CONTRAST TECHNIQUE: Multidetector CT imaging of the abdomen and pelvis was performed using the standard protocol following bolus administration of intravenous contrast. RADIATION DOSE REDUCTION: This exam was performed according to the departmental dose-optimization program which includes automated  exposure control, adjustment of the mA and/or kV according to patient size and/or use of iterative reconstruction technique. CONTRAST:  120m OMNIPAQUE IOHEXOL 300 MG/ML  SOLN COMPARISON:  CT abdomen and pelvis dated March 11, 2018 FINDINGS: Lower chest: No acute abnormality. Hepatobiliary: No focal liver abnormality is seen. Status post cholecystectomy. No biliary dilatation. Pancreas: Unremarkable. No pancreatic ductal dilatation or surrounding inflammatory changes. Spleen: Normal in size without focal abnormality. Adrenals/Urinary Tract: Bilateral adrenal glands are unremarkable. No hydronephrosis or nephrolithiasis bladder is unremarkable. Stomach/Bowel: Stomach  is within normal limits. Prior right hemicolectomy. No evidence of bowel wall thickening, distention, or inflammatory changes. Vascular/Lymphatic: Aortic atherosclerosis. No enlarged abdominal or pelvic lymph nodes. Reproductive: No adnexal masses. Other: No abdominal wall hernia or abnormality. No abdominopelvic ascites. Musculoskeletal: No acute or significant osseous findings. IMPRESSION: 1. No acute findings in the abdomen or pelvis. 2. Aortic Atherosclerosis (ICD10-I70.0). Electronically Signed   By: Yetta Glassman M.D.   On: 08/23/2022 09:55    Procedures Procedures    Medications Ordered in ED Medications  0.9 %  sodium chloride infusion (0 mLs Intravenous Stopped 08/23/22 1101)  sodium chloride 0.9 % bolus 500 mL (0 mLs Intravenous Stopped 08/23/22 1100)  ondansetron (ZOFRAN) injection 4 mg (4 mg Intravenous Given 08/23/22 0839)  iohexol (OMNIPAQUE) 300 MG/ML solution 100 mL (100 mLs Intravenous Contrast Given 08/23/22 0933)    ED Course/ Medical Decision Making/ A&P                           Medical Decision Making Amount and/or Complexity of Data Reviewed Labs: ordered. Radiology: ordered.  Risk Prescription drug management.  Patient's mucous membranes are slightly dry.  But otherwise nontoxic no acute distress.  No  fever.  Not tachycardic blood pressure is 134/68 oxygen saturation on room air is 96%.  Patient's lipase normal complete metabolic panel significant for potassium down a little bit at 3.2 GFR is normal at greater than 60 liver function test are normal.  Sodium down a little bit at 134 blood sugar 116 anion gap of 8 CBC no leukocytosis hemoglobin 17.3 may be hemoconcentrated.  Platelets normal.  CT scan of the abdomen without any acute findings.  Patient feels much better after antinausea medicine and IV fluids.  Will discharge home with Imodium and time nausea medicine follow-up with her primary care doctor to discuss the change in her diabetic meds possibly could be playing a role but may be not.  Final Clinical Impression(s) / ED Diagnoses Final diagnoses:  Diarrhea, unspecified type    Rx / DC Orders ED Discharge Orders          Ordered    ondansetron (ZOFRAN-ODT) 4 MG disintegrating tablet  Every 8 hours PRN        08/23/22 1015    loperamide (IMODIUM) 2 MG capsule  4 times daily PRN        08/23/22 1015              Fredia Sorrow, MD 08/23/22 1101

## 2022-08-31 ENCOUNTER — Other Ambulatory Visit: Payer: Self-pay | Admitting: Internal Medicine

## 2022-08-31 DIAGNOSIS — E7849 Other hyperlipidemia: Secondary | ICD-10-CM

## 2022-09-20 ENCOUNTER — Ambulatory Visit (HOSPITAL_COMMUNITY)
Admission: RE | Admit: 2022-09-20 | Discharge: 2022-09-20 | Disposition: A | Discharge status: Home / Self Care | Payer: Medicare Other | Source: Ambulatory Visit | Attending: Internal Medicine | Admitting: Internal Medicine

## 2022-09-20 DIAGNOSIS — E7849 Other hyperlipidemia: Secondary | ICD-10-CM

## 2022-09-20 MED ORDER — INCLISIRAN SODIUM 284 MG/1.5ML ~~LOC~~ SOSY
284.0000 mg | PREFILLED_SYRINGE | Freq: Once | SUBCUTANEOUS | Status: AC
  Administered 2022-09-20: 284 mg via SUBCUTANEOUS

## 2022-09-20 MED ORDER — INCLISIRAN SODIUM 284 MG/1.5ML ~~LOC~~ SOSY
PREFILLED_SYRINGE | SUBCUTANEOUS | Status: AC
  Filled 2022-09-20: qty 1.5

## 2022-10-18 ENCOUNTER — Other Ambulatory Visit: Payer: Self-pay | Admitting: Internal Medicine

## 2022-10-18 DIAGNOSIS — E7849 Other hyperlipidemia: Secondary | ICD-10-CM

## 2022-10-23 LAB — NMR, LIPOPROFILE
Cholesterol, Total: 168 mg/dL (ref 100–199)
HDL Particle Number: 37.4 umol/L (ref >=30.5)
HDL-C: 49 mg/dL (ref >39)
LDL Particle Number: 1149 nmol/L — ABNORMAL HIGH (ref <1000)
LDL Size: 20.5 nm — ABNORMAL LOW (ref >20.5)
LDL-C (NIH Calc): 84 mg/dL (ref 0–99)
LP-IR Score: 69 — ABNORMAL HIGH (ref <=45)
Small LDL Particle Number: 461 nmol/L (ref <=527)
Triglycerides: 206 mg/dL — ABNORMAL HIGH (ref 0–149)

## 2022-10-29 ENCOUNTER — Ambulatory Visit
Admission: EM | Admit: 2022-10-29 | Discharge: 2022-10-29 | Disposition: A | Discharge status: Home / Self Care | Payer: Medicare Other

## 2022-10-29 DIAGNOSIS — R040 Epistaxis: Secondary | ICD-10-CM

## 2022-10-29 NOTE — ED Provider Notes (Signed)
[facility]    CSN: [id number] Arrival date & time: 10/29/22  1105      History   Chief Complaint No chief complaint on file.   HPI Megan Hill is a 72 y.o. female.   The history is provided by the patient.   The patient presents right nosebleeds.  Patient states over the last 2 to 3 weeks, she has experienced at least 2 nosebleeds per week.  Today, patient states that she has had 2 nosebleeds lasting approximately 20 to 30 minutes each.  She has been able to control the bleeding.  She states prior to arriving to this clinic, she experienced a nosebleed that lasted 30 minutes, bleeding is fully controlled at this time.  Patient denies being on blood thinners, use of nasal sprays, chronic sinus infections, fever, chills, headaches, dizziness, chest pain, recent upper respiratory infections.  She states that she has started Prozac on the same time that the nosebleed started.  She states that she has not seen her PCP since she was started on the antidepressant.  Past Medical History:  Diagnosis Date   Colon adenomas    JUL 2007, MAR 2006   Diabetes mellitus without complication ([place])    Diverticulitis    Essential hypertension, benign 08/13/2019   Headache    Hypertension    Hypothyroidism, adult 08/13/2019   Primary ovarian failure 08/13/2019   Thyroid disease    Tubulovillous adenoma of colon 03/2006    Patient Active Problem List   Diagnosis Date Noted   Plantar fasciitis 11/18/2019   Hypothyroidism, adult 08/13/2019   Essential hypertension, benign 08/13/2019   Diabetes mellitus without complication ([place]) 19/37/9024   Primary ovarian failure 08/13/2019   Constipation 03/27/2019   Colon adenomas     Past Surgical History:  Procedure Laterality Date   APPENDECTOMY     CHOLECYSTECTOMY, LAPAROSCOPIC  2005   GALLSTONES TNTC, CHOLECYSTITIS   COLON SURGERY  2004   LARGE POLYP WITH FOCI OF CARCINOMA   COLONOSCOPY  10/2015   NL ANASTOMOSIS, HYPERPLASTIC  POLYP   HYSTERECTOMY ABDOMINAL WITH SALPINGO-OOPHORECTOMY  AGE 55   PROLAPSED UTERUS   TONSILLECTOMY      OB History   No obstetric history on file.      Home Medications    Prior to Admission medications   Medication Sig Start Date End Date Taking? Authorizing Provider  pantoprazole (PROTONIX) 40 MG tablet Take by mouth. 08/31/21  Yes [provider]  Ascorbic Acid (VITAMIN C) 1000 MG tablet Take 2,000 mg by mouth 2 (two) times a day.     [provider]  aspirin EC 81 MG tablet Take 81 mg by mouth daily.    [provider]  Cholecalciferol (VITAMIN D3) 5000 units CAPS Take 5,000 Units by mouth 2 (two) times a day.     [provider]  Cyanocobalamin (VITAMIN B-12) 2500 MCG SUBL Take by mouth daily.    [provider]  FLUoxetine (PROZAC) 10 MG capsule Take 10 mg by mouth daily.    [provider]  hydrochlorothiazide (HYDRODIURIL) 12.5 MG tablet TAKE 1 TABLET BY MOUTH DAILY 01/16/20 04/15/20  Doree Albee, MD  JARDIANCE 25 MG TABS tablet Take 25 mg by mouth daily.    [provider]  liothyronine (CYTOMEL) 5 MCG tablet TAKE 1 TABLET(5 MCG) BY MOUTH DAILY 03/22/20   Hurshel Party C, MD  loperamide (IMODIUM) 2 MG capsule Take 1 capsule (2 mg total) by mouth 4 (four) times daily  as needed for diarrhea or loose stools. 08/23/22   Fredia Sorrow, MD  loratadine (CLARITIN) 10 MG tablet Take 10 mg by mouth daily.    [provider]  losartan (COZAAR) 50 MG tablet TAKE 1 TABLET BY MOUTH DAILY 04/15/20 07/14/20  Ailene Ards, NP  metoprolol tartrate (LOPRESSOR) 25 MG tablet Take 1 tablet (25 mg total) by mouth 2 (two) times daily. 05/24/22 11/20/22  Pixie Casino, MD  NP THYROID 90 MG tablet TAKE 1 TABLET BY MOUTH EVERY DAY 05/18/20   Hurshel Party C, MD  ondansetron (ZOFRAN-ODT) 4 MG disintegrating tablet Take 1 tablet (4 mg total) by mouth every 8 (eight) hours as needed for nausea or vomiting. 08/23/22    Fredia Sorrow, MD    Family History Family History  Problem Relation Age of Onset   Colon polyps Father        "FULL OF POLYPS"   Colon cancer Paternal Aunt    Cervical cancer Daughter     Social History Social History   Tobacco Use   Smoking status: Never   Smokeless tobacco: Never  Substance Use Topics   Alcohol use: No   Drug use: No     Allergies   Metformin and related and Penicillins   Review of Systems Review of Systems Per HPI  Physical Exam Triage Vital Signs ED Triage Vitals  Enc Vitals Group     BP 10/29/22 1115 (!) 190/92     Pulse Rate 10/29/22 1115 73     Resp 10/29/22 1115 20     Temp 10/29/22 1115 98.1 F (36.7 C)     Temp src --      SpO2 10/29/22 1115 96 %     Weight --      Height --      Head Circumference --      Peak Flow --      Pain Score 10/29/22 1117 0     Pain Loc --      Pain Edu? --      Excl. in [place]? --    No data found.  Updated Vital Signs BP (!) 168/91 (BP Location: Right Arm)   Pulse 76   Temp 98.1 F (36.7 C)   Resp 20   SpO2 96%   Visual Acuity Right Eye Distance:   Left Eye Distance:   Bilateral Distance:    Right Eye Near:   Left Eye Near:    Bilateral Near:     Physical Exam Vitals and nursing note reviewed.  Constitutional:      General: She is not in acute distress.    Appearance: Normal appearance.  HENT:     Head: Normocephalic.     Right Ear: Tympanic membrane, ear canal and external ear normal.     Left Ear: Tympanic membrane, ear canal and external ear normal.     Nose: Mucosal edema present. No nasal deformity, signs of injury, nasal tenderness or rhinorrhea.     Right Nostril: Epistaxis present.     Right Turbinates: Enlarged.     Right Sinus: No maxillary sinus tenderness or frontal sinus tenderness.     Left Sinus: No maxillary sinus tenderness or frontal sinus tenderness.     Mouth/Throat:     Mouth: Mucous membranes are moist.  Eyes:     Extraocular Movements: Extraocular  movements intact.     Pupils: Pupils are equal, round, and reactive to light.  Cardiovascular:     Rate and Rhythm:  Normal rate and regular rhythm.     Pulses: Normal pulses.     Heart sounds: Normal heart sounds.  Pulmonary:     Effort: Pulmonary effort is normal.     Breath sounds: Normal breath sounds.  Abdominal:     General: Bowel sounds are normal.     Palpations: Abdomen is soft.  Musculoskeletal:     Cervical back: Normal range of motion.  Lymphadenopathy:     Cervical: No cervical adenopathy.  Skin:    General: Skin is warm and dry.  Neurological:     General: No focal deficit present.     Mental Status: She is alert and oriented to person, place, and time.  Psychiatric:        Mood and Affect: Mood normal.        Behavior: Behavior normal.      UC Treatments / Results  Labs (all labs ordered are listed, but only abnormal results are displayed) Labs Reviewed - No data to display  EKG   Radiology No results found.  Procedures Procedures (including critical care time)  Medications Ordered in UC Medications - No data to display  Initial Impression / Assessment and Plan / UC Course  I have reviewed the triage vital signs and the nursing notes.  Pertinent labs & imaging results that were available during my care of the patient were reviewed by me and considered in my medical decision making (see chart for details).  Patient is well-appearing, she is in no acute distress, repeat vital signs were obtained with a decrease in her blood pressure, although she does remain hypertensive.  Epistaxis appears to be related to the use of the new antidepressant Prozac.  Cannot ascertain any other cause that may be related to the patient's symptoms at this time.  Patient is scheduled to see her PCP next week, will have her follow-up with him at that time regarding the medication.  For home use, recommended Ayrs saline gel or Vaseline to coat the naris to help prevent or slow  bleeding.  Discussed with patient strict indications of when going to the emergency department may be indicated.  Also advised the use of tampons to help prevent recurrent nose bleeds will be helpful.  Patient verbalizes understanding.  All questions were answered.  Patient is stable for discharge.  Final Clinical Impressions(s) / UC Diagnoses   Final diagnoses:  Epistaxis     Discharge Instructions      Recommend Ayrs Saline Gel or Vaseline to help slow or prevent bleeding. Avoid harsh or recurrent nose blowing. Do not nasal sprays while symptoms persist. Continue to monitor your blood pressure to help decrease the recurrence or duration of the nosebleeds. As discussed, if you continue to experience recurrent nosebleeds lasting longer than 30 minutes or become more frequent, please go to the emergency department immediately.  Please follow-up with your primary care physician as scheduled for next week. Follow-up as needed.     ED Prescriptions   None    PDMP not reviewed this encounter.   Tish Men, NP 10/29/22 [phone]

## 2022-10-29 NOTE — Discharge Instructions (Signed)
Recommend Ayrs Saline Gel or Vaseline to help slow or prevent bleeding. Avoid harsh or recurrent nose blowing. Do not nasal sprays while symptoms persist. Continue to monitor your blood pressure to help decrease the recurrence or duration of the nosebleeds. As discussed, if you continue to experience recurrent nosebleeds lasting longer than 30 minutes or become more frequent, please go to the emergency department immediately.  Please follow-up with your primary care physician as scheduled for next week. Follow-up as needed.

## 2022-10-29 NOTE — ED Triage Notes (Signed)
Pt reports she has been having nose bleeds for 3 weeks. Today it started today a couple of hours ago out of nowhere.   Pt started Prozac and feels it started around then.  Pt reports no dizziness, no head injury

## 2022-10-31 ENCOUNTER — Ambulatory Visit (HOSPITAL_BASED_OUTPATIENT_CLINIC_OR_DEPARTMENT_OTHER): Payer: Medicare Other | Admitting: Internal Medicine

## 2022-11-01 ENCOUNTER — Encounter: Payer: Self-pay | Admitting: Internal Medicine

## 2022-11-01 ENCOUNTER — Ambulatory Visit: Payer: Medicare Other | Attending: Internal Medicine | Admitting: Internal Medicine

## 2022-11-01 VITALS — BP 140/80 | HR 82 | Ht 63.0 in | Wt 173.0 lb

## 2022-11-01 DIAGNOSIS — E7849 Other hyperlipidemia: Secondary | ICD-10-CM | POA: Diagnosis present

## 2022-11-01 NOTE — Patient Instructions (Addendum)
Medication Instructions:   Your physician recommends that you continue on your current medications as directed. Please refer to the Current Medication list given to you today.  *If you need a refill on your cardiac medications before your next appointment, please call your pharmacy*  Lab Work: Your physician recommends that you return for lab work in 1 year prior to follow up appointment:  NMR If you have labs (blood work) drawn today and your tests are completely normal, you will receive your results only by: MyChart Message (if you have MyChart) OR A paper copy in the mail If you have any lab test that is abnormal or we need to change your treatment, we will call you to review the results.  Testing/Procedures: NONE ordered at this time of appointment   Follow-Up: At [facility], you and your health needs are our priority.  As part of our continuing mission to provide you with exceptional heart care, we have created designated Provider Care Teams.  These Care Teams include your primary Cardiologist (physician) and Advanced Practice Providers (APPs -  Physician Assistants and Nurse Practitioners) who all work together to provide you with the care you need, when you need it.  Your next appointment:   1 year(s)  Provider:   Dr. [facility]      Other Instructions

## 2022-11-01 NOTE — Progress Notes (Signed)
LIPID CLINIC CONSULT NOTE  Chief Complaint:  Manage dyslipidemia, palpitations  Primary Care Physician: Megan Belt, DO  Primary Cardiologist:  Megan Dolly, MD  HPI:  Megan Hill is a 72 y.o. female who is being seen today for the evaluation of dyslipidemia/palpitations at the request of Hill, Megan P, DO.  This is a pleasant 72 year old female who has a history of hypertension, palpitations and thyroid disease as well as type 2 diabetes.  She has a longstanding history of high cholesterol but has been statin intolerant having tried numerous statins before with significant myalgias.  She is currently on no lipid-lowering therapy.  She has previously seen Dr. Harl Hill in our group for palpitations.  She was placed on metoprolol and has had some improvement in her symptoms but still is having some breakthrough palpitations.  She also takes liothyronine and NP thyroid.  Labs recently showed total cholesterol 256, triglycerides 233, HDL 52 and direct LDL 181.  She reports a varied diet but says she could try to further reduce saturated fat intake.  She also reports she has had a recent increase in her palpitations and feels like her current dose of metoprolol is not as effective as it was.  11/01/2022  Megan Hill is seen today in follow-up.  She has done well with Leqvio.  She has now had 2 injections, with her last injection in December.  Repeat lipids recently showed marked improvement.  Basically about 50% reduction in her cholesterol.  LDL particle numbers come down to 1149 from 2298.  LDL-C is now 84, down from 157.  Her small LDL particle number is down to 461 from 1242.  Her LP(a) was negative.  She has had no side effects with the medication.  This is excellent.  Unfortunately, she says she is going to be changing her insurance.  Ultimately this may not be covered however the insurance change would save them significant amount of money.  PMHx:  Past Medical History:   Diagnosis Date   Colon adenomas    JUL 2007, MAR 2006   Diabetes mellitus without complication ([place])    Diverticulitis    Essential hypertension, benign 08/13/2019   Headache    Hypertension    Hypothyroidism, adult 08/13/2019   Primary ovarian failure 08/13/2019   Thyroid disease    Tubulovillous adenoma of colon 03/2006    Past Surgical History:  Procedure Laterality Date   APPENDECTOMY     CHOLECYSTECTOMY, LAPAROSCOPIC  2005   GALLSTONES TNTC, CHOLECYSTITIS   COLON SURGERY  2004   LARGE POLYP WITH FOCI OF CARCINOMA   COLONOSCOPY  10/2015   NL ANASTOMOSIS, HYPERPLASTIC POLYP   HYSTERECTOMY ABDOMINAL WITH SALPINGO-OOPHORECTOMY  AGE 62   PROLAPSED UTERUS   TONSILLECTOMY      FAMHx:  Family History  Problem Relation Age of Onset   Colon Hill Father        "Megan Hill"   Colon cancer Paternal Aunt    Cervical cancer Daughter     SOCHx:   reports that she has never smoked. She has never used smokeless tobacco. She reports that she does not drink alcohol and does not use drugs.  ALLERGIES:  Allergies  Allergen Reactions   Metformin And Related Diarrhea   Penicillins     Has patient had a PCN reaction causing immediate rash, facial/tongue/throat swelling, SOB or lightheadedness with hypotension: Yes Has patient had a PCN reaction causing severe rash involving mucus membranes or skin necrosis: No Has  patient had a PCN reaction that required hospitalization: No Has patient had a PCN reaction occurring within the last 10 years: No If all of the above answers are "NO", then may proceed with Cephalosporin use.    ROS: Pertinent items noted in HPI and remainder of comprehensive ROS otherwise negative.  HOME MEDS: Current Outpatient Medications on File Prior to Visit  Medication Sig Dispense Refill   Ascorbic Acid (VITAMIN C) 1000 MG tablet Take 2,000 mg by mouth 2 (two) times a day.      aspirin EC 81 MG tablet Take 81 mg by mouth daily.     Cholecalciferol  (VITAMIN D3) 5000 units CAPS Take 5,000 Units by mouth 2 (two) times a day.      Cyanocobalamin (VITAMIN B-12) 2500 MCG SUBL Take by mouth daily.     FLUoxetine (PROZAC) 10 MG capsule Take 10 mg by mouth daily.     hydrochlorothiazide (HYDRODIURIL) 12.5 MG tablet TAKE 1 TABLET BY MOUTH DAILY 90 tablet 0   JARDIANCE 25 MG TABS tablet Take 25 mg by mouth daily.     liothyronine (CYTOMEL) 5 MCG tablet TAKE 1 TABLET(5 MCG) BY MOUTH DAILY 30 tablet 3   loperamide (IMODIUM) 2 MG capsule Take 1 capsule (2 mg total) by mouth 4 (four) times daily as needed for diarrhea or loose stools. 12 capsule 0   loratadine (CLARITIN) 10 MG tablet Take 10 mg by mouth daily.     losartan (COZAAR) 50 MG tablet TAKE 1 TABLET BY MOUTH DAILY 90 tablet 0   metoprolol tartrate (LOPRESSOR) 25 MG tablet Take 1 tablet (25 mg total) by mouth 2 (two) times daily. 180 tablet 1   NP THYROID 90 MG tablet TAKE 1 TABLET BY MOUTH EVERY DAY 30 tablet 3   ondansetron (ZOFRAN-ODT) 4 MG disintegrating tablet Take 1 tablet (4 mg total) by mouth every 8 (eight) hours as needed for nausea or vomiting. 12 tablet 1   pantoprazole (PROTONIX) 40 MG tablet Take by mouth.     No current facility-administered medications on file prior to visit.    LABS/IMAGING: No results found for this or any previous visit (from the past 48 hour(s)). No results found.  LIPID PANEL: No results found for: "CHOL", "TRIG", "HDL", "CHOLHDL", "VLDL", "LDLCALC", "LDLDIRECT"  WEIGHTS: Wt Readings from Last 3 Encounters:  11/01/22 173 lb (78.5 kg)  08/23/22 163 lb (73.9 kg)  05/24/22 176 lb (79.8 kg)    VITALS: BP (!) 140/80   Pulse 82   Ht '5\' 3"'$  (1.6 m)   Wt 173 lb (78.5 kg)   SpO2 97%   BMI 30.65 kg/m   EXAM: Deferred  EKG: Deferred  ASSESSMENT: Mixed dyslipidemia with possible familial hyperlipidemia, goal LDL less than 70 Type 2 diabetes Hypothyroidism Hypertension Palpitations  PLAN: 1.   Ms. Barfuss has had an excellent response to  [facility] with no side effects.  We may need to transition her over to every 2-week antibody PCSK9 inhibitor therapy depending on whether her new insurance will cover this next year.  She is already scheduled for an injection in June and should have another injection of Leqvio in December.  Will put in orders for those.  Otherwise plan follow-up with me in 1 year with repeat lipid NMR.  Pixie Casino, MD, [facility], [place] Director of the Advanced Lipid Disorders &  Cardiovascular Risk Reduction Clinic Diplomate of the American Board of Clinical Lipidology Attending Cardiologist  Direct  Dial: [phone]  Fax: [phone]  Website:  [email or web address] [email or web address] Kino Dunsworth 11/01/2022, 9:26 AM

## 2022-11-03 ENCOUNTER — Ambulatory Visit: Payer: Medicare Other | Admitting: Internal Medicine

## 2022-11-17 ENCOUNTER — Encounter: Payer: Self-pay | Admitting: Internal Medicine

## 2023-01-14 ENCOUNTER — Ambulatory Visit
Admission: EM | Admit: 2023-01-14 | Discharge: 2023-01-14 | Disposition: A | Discharge status: Home / Self Care | Payer: Medicare Other | Attending: Family Medicine | Admitting: Family Medicine

## 2023-01-14 DIAGNOSIS — R519 Headache, unspecified: Secondary | ICD-10-CM | POA: Diagnosis not present

## 2023-01-14 DIAGNOSIS — J3489 Other specified disorders of nose and nasal sinuses: Secondary | ICD-10-CM

## 2023-01-14 MED ORDER — KETOROLAC TROMETHAMINE 30 MG/ML IJ SOLN
30.0000 mg | Freq: Once | INTRAMUSCULAR | Status: AC
  Administered 2023-01-14: 30 mg via INTRAMUSCULAR

## 2023-01-14 MED ORDER — SULFAMETHOXAZOLE-TRIMETHOPRIM 800-160 MG PO TABS: 1.0000 | ORAL_TABLET | Freq: Two times a day (BID) | ORAL | 0 refills | Status: AC

## 2023-01-14 NOTE — ED Provider Notes (Signed)
[facility]   [id number] 01/14/23 Arrival Time: 0949  ASSESSMENT & PLAN:  1. Nasal pain   2. Right facial pain    Will call her ENT Monday morning.  Meds ordered this encounter  Medications   sulfamethoxazole-trimethoprim (BACTRIM DS) 800-160 MG tablet    Sig: Take 1 tablet by mouth 2 (two) times daily for 10 days.    Dispense:  20 tablet    Refill:  0   ketorolac (TORADOL) 30 MG/ML injection 30 mg   OTC as needed.  Reviewed expectations re: course of current medical issues. Questions answered. Outlined signs and symptoms indicating need for more acute intervention. Patient verbalized understanding. After Visit Summary given.   SUBJECTIVE: History from: patient.  Megan Hill is a 72 y.o. female who presents with complaint of right ear ache, sore throat, eyes hurting, eyes running, sore nose, runny nose x 4 days. "Just feel the pain on the left side of my face. Has an area in her nose cauterized by ENT sev days ago; questions relation. Denies nosebleeds.  Social History   Tobacco Use  Smoking Status Never  Smokeless Tobacco Never   OBJECTIVE:  Vitals:   01/14/23 0957  BP: 139/73  Pulse: 77  Resp: 18  Temp: 97.6 F (36.4 C)  TempSrc: Oral  SpO2: 96%    General appearance: alert; no distress HEENT: nasal congestion; clear runny nose; throat irritation secondary to post-nasal drainage; LEFT maxillary tenderness to palpation; left inner naris healing from cauterization Neck: supple without LAD; trachea midline Lungs: unlabored respirations, symmetrical air entry; no respiratory distress Skin: warm and dry Psychological: alert and cooperative; normal mood and affect  Allergies  Allergen Reactions   Metformin And Related Diarrhea   Penicillins     Has patient had a PCN reaction causing immediate rash, facial/tongue/throat swelling, SOB or lightheadedness with hypotension: Yes Has patient had a PCN reaction causing severe rash involving mucus  membranes or skin necrosis: No Has patient had a PCN reaction that required hospitalization: No Has patient had a PCN reaction occurring within the last 10 years: No If all of the above answers are "NO", then may proceed with Cephalosporin use.    Past Medical History:  Diagnosis Date   Colon adenomas    JUL 2007, MAR 2006   Diabetes mellitus without complication    Diverticulitis    Essential hypertension, benign 08/13/2019   Headache    Hypertension    Hypothyroidism, adult 08/13/2019   Primary ovarian failure 08/13/2019   Thyroid disease    Tubulovillous adenoma of colon 03/2006   Family History  Problem Relation Age of Onset   Colon polyps Father        "FULL OF POLYPS"   Colon cancer Paternal Aunt    Cervical cancer Daughter    Social History   Socioeconomic History   Marital status: Married    Spouse name: Not on file   Number of children: Not on file   Years of education: Not on file   Highest education level: Not on file  Occupational History   Not on file  Tobacco Use   Smoking status: Never   Smokeless tobacco: Never  Substance and Sexual Activity   Alcohol use: No   Drug use: No   Sexual activity: Yes    Birth control/protection: Surgical  Other Topics Concern   Not on file  Social History Narrative   WGNFAOZ([id number]). KIDS-2(BORN 1974, 76). OLDEST DAUGHTER WORKS IN [place]. HOBBIES: HOME SCHOOLS 11  YO [employer], [place], [employer], [employer]. 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   Social Determinants of Health   Financial Resource Strain: Not on file  Food Insecurity: Not on file  Transportation Needs: Not on file  Physical Activity: Not on file  Stress: Not on file  Social Connections: Not on file  Intimate Partner Violence: Not on file             Mardella Layman, MD 01/14/23 1215

## 2023-01-14 NOTE — Discharge Instructions (Addendum)
Call your ENT doctor Monday morning to arrange prompt follow up.  Meds ordered this encounter  Medications   sulfamethoxazole-trimethoprim (BACTRIM DS) 800-160 MG tablet    Sig: Take 1 tablet by mouth 2 (two) times daily for 10 days.    Dispense:  20 tablet    Refill:  0   ketorolac (TORADOL) 30 MG/ML injection 30 mg

## 2023-01-14 NOTE — ED Triage Notes (Signed)
Pt reports her right ear aches, sore throat, eyes hurting, eyes running, sore nose, runny nose x 4 days.  Took benadryl and alketerzer cold and sinus but no relief.

## 2023-03-17 ENCOUNTER — Other Ambulatory Visit (HOSPITAL_COMMUNITY): Payer: Self-pay | Admitting: *Deleted

## 2023-03-21 ENCOUNTER — Ambulatory Visit (HOSPITAL_COMMUNITY)
Admission: RE | Admit: 2023-03-21 | Discharge: 2023-03-21 | Disposition: A | Discharge status: Home / Self Care | Payer: Medicare Other | Source: Ambulatory Visit | Attending: Internal Medicine | Admitting: Internal Medicine

## 2023-03-21 DIAGNOSIS — E7849 Other hyperlipidemia: Secondary | ICD-10-CM | POA: Insufficient documentation

## 2023-03-21 MED ORDER — INCLISIRAN SODIUM 284 MG/1.5ML ~~LOC~~ SOSY
PREFILLED_SYRINGE | SUBCUTANEOUS | Status: AC
  Filled 2023-03-21: qty 1.5

## 2023-03-21 MED ORDER — INCLISIRAN SODIUM 284 MG/1.5ML ~~LOC~~ SOSY
284.0000 mg | PREFILLED_SYRINGE | Freq: Once | SUBCUTANEOUS | Status: AC
  Administered 2023-03-21: 284 mg via SUBCUTANEOUS

## 2023-03-28 ENCOUNTER — Other Ambulatory Visit (HOSPITAL_BASED_OUTPATIENT_CLINIC_OR_DEPARTMENT_OTHER): Payer: Self-pay | Admitting: Internal Medicine

## 2023-05-16 ENCOUNTER — Telehealth: Payer: Self-pay | Admitting: Internal Medicine

## 2023-05-16 NOTE — Telephone Encounter (Addendum)
Patient was calling to see about follow up. Schedule not available for February 2025. She will call back next month

## 2023-05-16 NOTE — Telephone Encounter (Signed)
Pt would like to know if she should get her cholesterol levels checked before the appointment in December. Please advise

## 2023-09-19 ENCOUNTER — Other Ambulatory Visit (HOSPITAL_COMMUNITY): Payer: Self-pay | Admitting: *Deleted

## 2023-09-21 ENCOUNTER — Ambulatory Visit (HOSPITAL_COMMUNITY)
Admission: RE | Admit: 2023-09-21 | Discharge: 2023-09-21 | Disposition: A | Discharge status: Home / Self Care | Payer: Medicare Other | Source: Ambulatory Visit | Attending: Internal Medicine | Admitting: Internal Medicine

## 2023-09-21 DIAGNOSIS — E7849 Other hyperlipidemia: Secondary | ICD-10-CM | POA: Diagnosis present

## 2023-09-21 MED ORDER — INCLISIRAN SODIUM 284 MG/1.5ML ~~LOC~~ SOSY
284.0000 mg | PREFILLED_SYRINGE | Freq: Once | SUBCUTANEOUS | Status: AC
  Administered 2023-09-21: 284 mg via SUBCUTANEOUS

## 2023-09-21 MED ORDER — INCLISIRAN SODIUM 284 MG/1.5ML ~~LOC~~ SOSY
PREFILLED_SYRINGE | SUBCUTANEOUS | Status: AC
  Filled 2023-09-21: qty 1.5

## 2023-10-23 ENCOUNTER — Telehealth: Payer: Self-pay | Admitting: Internal Medicine

## 2023-10-23 DIAGNOSIS — E7801 Familial hypercholesterolemia: Secondary | ICD-10-CM

## 2023-10-23 NOTE — Telephone Encounter (Signed)
Pt c/o medication issue:  1. Name of Medication: Leqvio  2. How are you currently taking this medication (dosage and times per day)? As directed  3. Are you having a reaction (difficulty breathing--STAT)? no  4. What is your medication issue? Patient's insurance has switched to [facility], I have updated this in her MyChart but they are requiring Prior Authorization now. Her next injection is in June. Please advise.

## 2023-10-24 ENCOUNTER — Other Ambulatory Visit (HOSPITAL_COMMUNITY): Payer: Self-pay

## 2023-10-24 ENCOUNTER — Telehealth: Payer: Self-pay | Admitting: Pharmacy Technician

## 2023-10-24 NOTE — Telephone Encounter (Signed)
Sent kim chambers this : Patient's insurance has switched to [employer], I have updated this in her MyChart but they are requiring Prior Authorization now. Her next injection is in June. Please advise.

## 2023-10-24 NOTE — Telephone Encounter (Signed)
Megan Hill,  Will this patient been seen at [facility]? If so, please enter a treatment plan and submit a start form to the [facility] service center and I will submit a new auth. (Pt has new insurance- [employer])  Her next appt is not due till 6/25.

## 2023-11-08 NOTE — Telephone Encounter (Signed)
Left message to call back. Will need to change to outpatient infusion center and complete steps as Selena Batten C noted below

## 2023-11-09 NOTE — Telephone Encounter (Signed)
Pt returning nurses call from yesterday regarding PA for Leqvio. Pt would like a c/b regarding this matter. Please advise

## 2023-11-09 NOTE — Telephone Encounter (Signed)
Patient identification verified by 2 forms. Marilynn Rail, RN    Called and spoke to patient  Informed patient:   -no updates available regarding PA   -unsure if PA is requested too soon   -message sent to PA team for assistance  Patient verbalized understanding, no questions at this time

## 2023-11-10 NOTE — Telephone Encounter (Signed)
Megan Hill, Patient is requesting an update on auth status. Awaiting treatment to be entered.  Please enter the treatment plan so I can begin the auth process.  Please submit the start form to the [facility] for them to begin the BIV

## 2023-11-13 ENCOUNTER — Telehealth: Payer: Self-pay

## 2023-11-13 ENCOUNTER — Encounter: Payer: Self-pay | Admitting: Internal Medicine

## 2023-11-13 DIAGNOSIS — E7801 Familial hypercholesterolemia: Secondary | ICD-10-CM | POA: Insufficient documentation

## 2023-11-13 NOTE — Telephone Encounter (Signed)
Duplicate message opened re: same subject. Will close out this encounter.

## 2023-11-13 NOTE — Telephone Encounter (Signed)
Spoke with patient. She will bring by a copy of her new [employer]. This is required for uploading to the [facility] service center portal. She will bring by in about 2 weeks and have front desk scan to chart and notify me when completed.

## 2023-11-13 NOTE — Telephone Encounter (Signed)
Auth Submission: APPROVED Site of care: Site of care: [facility] Payer: [employer] Medication & CPT/J Code(s) submitted: Leqvio (Inclisiran) [id number] Route of submission (phone, fax, portal): portal Phone # Fax # Auth type: Buy/Bill PB Units/visits requested: 284mg  x 3 doses Reference number: [id number] Approval from: 11/13/23 to 09/25/24

## 2023-12-06 NOTE — Telephone Encounter (Signed)
 Nonnie Done, CPhT  You; Bernita Buffy, RN; Amalia Greenhouse, CPhT18 hours ago (2:01 PM)   This patient has been approved for the [employer]. The [employer] will cover any copay the patient has. Thank you

## 2023-12-08 NOTE — Addendum Note (Signed)
 Addended by: Lindell Spar on: 12/08/2023 09:29 AM   Modules accepted: Orders

## 2023-12-08 NOTE — Telephone Encounter (Signed)
 Spoke with patient. Provided an update on PA approval and [employer] grant approval. Leqvio injection 03/21/24. Will need a follow up at some point (was due for annual 09/2023). Recall entered.

## 2023-12-13 ENCOUNTER — Other Ambulatory Visit: Payer: Self-pay | Admitting: Family Medicine

## 2023-12-13 DIAGNOSIS — Z780 Asymptomatic menopausal state: Secondary | ICD-10-CM

## 2024-01-18 ENCOUNTER — Other Ambulatory Visit (HOSPITAL_BASED_OUTPATIENT_CLINIC_OR_DEPARTMENT_OTHER): Payer: Self-pay | Admitting: Cardiology

## 2024-03-20 ENCOUNTER — Encounter: Payer: Self-pay | Admitting: Internal Medicine

## 2024-03-21 ENCOUNTER — Ambulatory Visit (HOSPITAL_COMMUNITY)
Admission: RE | Admit: 2024-03-21 | Discharge: 2024-03-21 | Disposition: A | Discharge status: Home / Self Care | Payer: Medicare Other | Source: Ambulatory Visit | Attending: Internal Medicine | Admitting: Internal Medicine

## 2024-03-21 ENCOUNTER — Ambulatory Visit: Payer: Medicare Other

## 2024-03-21 VITALS — BP 121/64 | HR 74 | Temp 97.4°F | Resp 16

## 2024-03-21 DIAGNOSIS — E7801 Familial hypercholesterolemia: Secondary | ICD-10-CM | POA: Insufficient documentation

## 2024-03-21 MED ORDER — INCLISIRAN SODIUM 284 MG/1.5ML ~~LOC~~ SOSY: 284.0000 mg | PREFILLED_SYRINGE | SUBCUTANEOUS | Status: DC

## 2024-03-21 MED ORDER — INCLISIRAN SODIUM 284 MG/1.5ML ~~LOC~~ SOSY
PREFILLED_SYRINGE | SUBCUTANEOUS | Status: AC
  Filled 2024-03-21: qty 1.5

## 2024-03-21 MED ORDER — INCLISIRAN SODIUM 284 MG/1.5ML ~~LOC~~ SOSY
284.0000 mg | PREFILLED_SYRINGE | SUBCUTANEOUS | Status: DC
  Administered 2024-03-21: 284 mg via SUBCUTANEOUS

## 2024-05-22 NOTE — Progress Notes (Unsigned)
 Cardiology Office Note   Date:  05/29/2024  ID:  Megan Hill, Megan Hill [birth year], MRN [id number] PCP: Macarthur Elouise SQUIBB, DO  [facility] Providers Cardiologist:  Alvan Carrier, MD     PMH Hypertension Palpitations Statin myalgia Dyslipidemia Hypothyroidism Possible familial hyperlipidemia  Type 2 diabetes mellitus  Referred to Advanced Lipid disorders clinic and seen by Dr. Mona 05/24/2022.  She had previously been seen by Dr. Alvan for palpitations.  Longstanding history of high cholesterol, intolerant of multiple statins which caused myalgias.  At the time of referral she was not on any lipid-lowering therapy.  She had been placed on metoprolol  for palpitations with some improvement but was still having breakthrough symptoms.  Labs showed total cholesterol 256, triglycerides 233, HDL 52, and direct LDL 181.  She reported a varied diet but tries to reduce saturated fat intake.  She had also felt a recent increase in her palpitations.  She was on therapy for hypothyroidism.  She was started on Leqvio  with significant improvement in lipids.  LDL particle numbers came down to 1149 from 2298, LDL-C down to 84 from 157.  Small LDL particle number improved to 461 from 1242.  Her LP(a) was negative.  She was not having any concerning side effects with Leqvio .  At return office visit 11/01/2022 there was concern that insurance would no longer cover Leqvio .   Lipid panel completed 09/25/2023 with total cholesterol 184, triglycerides 399, HDL 39, and LDL-C 92.  History of Present Illness Discussed the use of AI scribe software for clinical note transcription with the patient, who gave verbal consent to proceed.  History of Present Illness Megan Hill is a very pleasant 73 year old female who is here today for follow-up of dyslipidemia. She is accompanied by her husband. She admits slight increase in lipid levels thought to be secondary to more inactivity and dietary indiscretion  since having left shoulder surgery several months ago.  She is tolerating Leqvio  with no concerning side effects.  She is very pleased as she has history of intolerance to various oral cholesterol medications due to side effects. Her blood pressure is elevated today, though it has been as low as the 120s previously. She occasionally experiences minor, fleeting palpitations. She is on metoprolol , losartan 100 mg, and aspirin 81 mg. She reduced her metoprolol  dosage due to side effects of low blood pressure and lightheadedness. She has experienced significant weight loss over the past year, attributed to Mounjaro, which she has been on a maintenance dose of 10 mg for about six months. Her blood sugar levels have been well-controlled with this medication, though recently slightly elevated due to dietary indiscretions.  ROS: See HPI  Studies Reviewed EKG Interpretation Date/Time:  Tuesday May 28 2024 15:23:00 EDT Ventricular Rate:  79 PR Interval:  162 QRS Duration:  80 QT Interval:  376 QTC Calculation: 431 R Axis:   39  Text Interpretation: Normal sinus rhythm Normal ECG When compared with ECG of 25-Jul-2017 09:05, PREVIOUS ECG IS PRESENT Confirmed by Percy Browning [phone]) on 05/28/2024 3:34:36 PM     Lipoprotein (a)  Date/Time Value Ref Range Status  05/25/2022 08:18 AM <8.4 <75.0 nmol/L Final    Comment:    **Results verified by repeat testing** Note:  Values greater than or equal to 75.0 nmol/L may        indicate an independent risk factor for CHD,        but must be evaluated with caution when applied  to non-Caucasian populations due to the        influence of genetic factors on Lp(a) across        ethnicities.     Risk Assessment/Calculations   HYPERTENSION CONTROL Vitals:   05/28/24 1524 05/28/24 1547  BP: (!) 144/68 (!) 142/76    The patient's blood pressure is elevated above target today.  In order to address the patient's elevated BP: Blood pressure  will be monitored at home to determine if medication changes need to be made.          Physical Exam VS:  BP (!) 142/76   Pulse 79   Ht 5' 3.5 (1.613 m)   Wt 149 lb 3.2 oz (67.7 kg)   SpO2 97%   BMI 26.02 kg/m    Wt Readings from Last 3 Encounters:  05/28/24 149 lb 3.2 oz (67.7 kg)  11/01/22 173 lb (78.5 kg)  08/23/22 163 lb (73.9 kg)    GEN: Well nourished, well developed in no acute distress NECK: No JVD; No carotid bruits CARDIAC: RRR, no murmurs, rubs, gallops RESPIRATORY:  Clear to auscultation without rales, wheezing or rhonchi  ABDOMEN: Soft, non-tender, non-distended EXTREMITIES:  No edema; No deformity    Assessment & Plan Hyperlipidemia LDL goal < 70 Cardiac risk Aortic atherosclerosis Statin intolerance LDL goal < 70 secondary to diabetes, aortic atherosclerosis. She is on Leqvio  which she is tolerating well with no concerning side effects.  She admits to some dietary indiscretion recently as well as being less active secondary to recent shoulder surgery. Significant weight loss from Mounjaro is beneficial for cardiovascular risk reduction. Of note, there was documentation in her chart that she was on Repatha, however this is incorrect and she is not sure where this information came from.  EKG today reveals NSR with no ST/T abnormality. She denies chest pain, dyspnea, or other symptoms concerning for angina. CT calcium score of 0 on 05/2022. No indication for further ischemic evaluation at this time. She is not fasting today, so we will await soon appointment with PCP for repeat lipid testing. Next Leqvio  injection scheduled for 08/2024.  -Focus on secondary prevention including heart healthy mostly plant based diet avoiding saturated fat, processed foods, simple carbohydrates, and sugar  -Aim for at least 150 minutes of moderate intensity exercise each week.  -Continue Leqvio  injections as scheduled -Has upcoming lipid panel scheduled with PCP, please send  copies  Essential hypertension   Blood pressure is elevated at 142/76 mmHg, possibly due to inactivity and dietary changes post-surgery. She is on metoprolol  and losartan, with a reduced metoprolol  dose due to previous lightheadedness.  -Monitor home BP with appropriate technique demonstrated for goal < 140/80 -Low sodium, heart healthy diet -Encourage resumption of physical activity as the shoulder allows -Report continued elevated BP readings at upcoming appointment with PCP  Type 2 diabetes mellitus   A1C 6.6% on 01/01/24, down from 7.2% 08/2023. Managed with Mounjaro, which is well-tolerated. Recent slight elevation in blood sugar is likely due to dietary indiscretions post-surgery. -Continue Mounjaro  -Encourage resumption of physical activity, including weightlifting or resistance exercises, as the shoulder allows -Aim for at least 150 minutes of moderate intensity exercise each week -Heart healthy diet limiting saturated fat, processed foods, sugar and other simple carbohydrates  Palpitations Remain well controlled on lower dose of metoprolol .         Dispo: 1 year with Dr. Mona or me  Signed, Rosaline Bane, NP-C

## 2024-05-24 ENCOUNTER — Encounter: Payer: Self-pay | Admitting: Internal Medicine

## 2024-05-28 ENCOUNTER — Ambulatory Visit (HOSPITAL_BASED_OUTPATIENT_CLINIC_OR_DEPARTMENT_OTHER): Admitting: Nurse Practitioner

## 2024-05-28 ENCOUNTER — Encounter: Payer: Self-pay | Admitting: Internal Medicine

## 2024-05-28 VITALS — BP 142/76 | HR 79 | Ht 63.5 in | Wt 149.2 lb

## 2024-05-28 DIAGNOSIS — R002 Palpitations: Secondary | ICD-10-CM | POA: Diagnosis not present

## 2024-05-28 DIAGNOSIS — I700 Atherosclerosis of aorta: Secondary | ICD-10-CM

## 2024-05-28 DIAGNOSIS — Z789 Other specified health status: Secondary | ICD-10-CM

## 2024-05-28 DIAGNOSIS — E785 Hyperlipidemia, unspecified: Secondary | ICD-10-CM | POA: Diagnosis not present

## 2024-05-28 DIAGNOSIS — I10 Essential (primary) hypertension: Secondary | ICD-10-CM | POA: Diagnosis not present

## 2024-05-28 DIAGNOSIS — E7849 Other hyperlipidemia: Secondary | ICD-10-CM | POA: Diagnosis not present

## 2024-05-28 DIAGNOSIS — E119 Type 2 diabetes mellitus without complications: Secondary | ICD-10-CM

## 2024-05-28 NOTE — Patient Instructions (Signed)
 Medication Instructions:  Your physician recommends that you continue on your current medications as directed. Please refer to the Current Medication list given to you today.  *If you need a refill on your cardiac medications before your next appointment, please call your pharmacy*  Lab Work: **PLEASE send us  your cholesterol labs when done by PCP If you have labs (blood work) drawn today and your tests are completely normal, you will receive your results only by: MyChart Message (if you have MyChart) OR A paper copy in the mail If you have any lab test that is abnormal or we need to change your treatment, we will call you to review the results.  Testing/Procedures: None Ordered  Follow-Up: At [facility], you and your health needs are our priority.  As part of our continuing mission to provide you with exceptional heart care, our providers are all part of one team.  This team includes your primary Cardiologist (physician) and Advanced Practice Providers or APPs (Physician Assistants and Nurse Practitioners) who all work together to provide you with the care you need, when you need it.  Your next appointment:   1 year(s)  Provider:   K. Italy Hilty, MD or Rosaline Bane, NP    We recommend signing up for the patient portal called MyChart.  Sign up information is provided on this After Visit Summary.  MyChart is used to connect with patients for Virtual Visits (Telemedicine).  Patients are able to view lab/test results, encounter notes, upcoming appointments, etc.  Non-urgent messages can be sent to your provider as well.   To learn more about what you can do with MyChart, go to [email or web address].   Other Instructions Adopting a Healthy Lifestyle.   Weight: Know what a healthy weight is for you (roughly BMI <25) and aim to maintain this. You can calculate your body mass index on your smart phone. Unfortunately, this is not the most accurate measure of healthy weight,  but it is the simplest measurement to use. A more accurate measurement involves body scanning which measures lean muscle, fat tissue and bony density. We do not have this equipment at [facility].    Diet: Aim for 7+ servings of fruits and vegetables daily Limit animal fats in diet for cholesterol and heart health - choose grass fed whenever available Avoid highly processed foods (fast food burgers, tacos, fried chicken, pizza, hot dogs, french fries)  Saturated fat comes in the form of butter, lard, coconut oil, margarine, partially hydrogenated oils, and fat in meat. These increase your risk of cardiovascular disease.  Use healthy plant oils, such as olive, canola, soy, corn, sunflower and peanut.  Whole foods such as fruits, vegetables and whole grains have fiber  Men need > 38 grams of fiber per day Women need > 25 grams of fiber per day  Load up on vegetables and fruits - one-half of your plate: Aim for color and variety, and remember that potatoes dont count. Go for whole grains - one-quarter of your plate: Whole wheat, barley, wheat berries, quinoa, oats, brown rice, and foods made with them. If you want pasta, go with whole wheat pasta. Protein power - one-quarter of your plate: Fish, chicken, beans, and nuts are all healthy, versatile protein sources. Limit red meat. You need carbohydrates for energy! The type of carbohydrate is more important than the amount. Choose carbohydrates such as vegetables, fruits, whole grains, beans, and nuts in the place of white rice, white pasta, potatoes (baked or fried), macaroni and cheese,  cakes, cookies, and donuts.  If youre thirsty, drink water. Coffee and tea are good in moderation, but skip sugary drinks and limit milk and dairy products to one or two daily servings. Keep sugar intake at 6 teaspoons or 24 grams or LESS       Exercise: Aim for 150 min of moderate intensity exercise weekly for heart health, and weights twice weekly for bone  health Stay active - any steps are better than no steps! Aim for 7-9 hours of sleep daily

## 2024-05-29 ENCOUNTER — Encounter (HOSPITAL_BASED_OUTPATIENT_CLINIC_OR_DEPARTMENT_OTHER): Payer: Self-pay | Admitting: Nurse Practitioner

## 2024-06-10 ENCOUNTER — Other Ambulatory Visit (HOSPITAL_COMMUNITY): Payer: Self-pay | Admitting: Internal Medicine

## 2024-06-11 ENCOUNTER — Telehealth (HOSPITAL_COMMUNITY): Payer: Self-pay | Admitting: Pharmacy Technician

## 2024-06-11 NOTE — Telephone Encounter (Signed)
 Auth Submission: APPROVED Site of care: [facility] Payer: [employer] Medication & CPT/J Code(s) submitted: Leqvio  (Inclisiran) J1306 Route of submission (phone, fax, portal): portal Phone # [phone]  Fax # Auth type: Buy/Bill HB Units/visits requested: 284mg  x 3 doses Reference number: [id number] Approval from: 11/13/23 to 09/25/24  Pt is seen at [facility]. Called [employer] and added Facility NPI to [place].   Amsi Grimley, CPhT [facility] Phone: [phone] 06/11/2024

## 2024-07-26 ENCOUNTER — Telehealth: Payer: Self-pay

## 2024-07-26 NOTE — Telephone Encounter (Signed)
 Auth Submission: APPROVED - renewal Site of care: Site of care: [facility] Payer: [employer] medicare Medication & CPT/J Code(s) submitted: Leqvio  (Inclisiran) J1306 Diagnosis Code:  Route of submission (phone, fax, portal): auto renew Phone # Fax # Auth type: Buy/Bill PB Units/visits requested: 284mg  x 2 doses Reference number: [id number] Approval from: 09/26/24 to 09/25/25   Renewal letter is in the media tab. Auth # will need to be updated in the referral after the 09/23/24 DOS.

## 2024-08-14 ENCOUNTER — Other Ambulatory Visit (HOSPITAL_BASED_OUTPATIENT_CLINIC_OR_DEPARTMENT_OTHER): Payer: Self-pay | Admitting: Cardiology

## 2024-09-23 ENCOUNTER — Ambulatory Visit (HOSPITAL_COMMUNITY)
Admission: RE | Admit: 2024-09-23 | Discharge: 2024-09-23 | Disposition: A | Discharge status: Home / Self Care | Payer: Self-pay | Source: Ambulatory Visit | Attending: Internal Medicine | Admitting: Internal Medicine

## 2024-09-23 DIAGNOSIS — E78019 Familial hypercholesterolemia, unspecified: Secondary | ICD-10-CM | POA: Diagnosis present

## 2024-09-23 MED ORDER — INCLISIRAN SODIUM 284 MG/1.5ML ~~LOC~~ SOSY
284.0000 mg | PREFILLED_SYRINGE | Freq: Once | SUBCUTANEOUS | Status: AC
  Administered 2024-09-23: 284 mg via SUBCUTANEOUS

## 2024-09-23 MED ORDER — INCLISIRAN SODIUM 284 MG/1.5ML ~~LOC~~ SOSY
PREFILLED_SYRINGE | SUBCUTANEOUS | Status: AC
  Filled 2024-09-23: qty 1.5

## 2024-10-02 ENCOUNTER — Telehealth (HOSPITAL_COMMUNITY): Payer: Self-pay

## 2024-10-02 NOTE — Telephone Encounter (Signed)
 Auth Submission: APPROVED Site of care: Site of care: [facility] Payer: [employer] Medication & CPT/J Code(s) submitted: Leqvio  (Inclisiran) J1306 Diagnosis Code: E78.019 Route of submission (phone, fax, portal):  Phone # Fax # Auth type: Buy/Bill HB Units/visits requested: 284mg  q59months Reference number: [id number] Approval from: 09/26/24 to 09/25/25

## 2025-03-25 ENCOUNTER — Encounter (HOSPITAL_COMMUNITY)
